# Patient Record
Sex: Female | Born: 1940 | Race: White | Hispanic: No | Marital: Single | Smoking: Never smoker
Health system: Southern US, Community
[De-identification: ages and names within clinical notes are randomized; demographics above are authoritative.]

## PROBLEM LIST (undated history)

## (undated) DIAGNOSIS — F419 Anxiety disorder, unspecified: Secondary | ICD-10-CM

## (undated) DIAGNOSIS — C73 Malignant neoplasm of thyroid gland: Secondary | ICD-10-CM

## (undated) DIAGNOSIS — M8580 Other specified disorders of bone density and structure, unspecified site: Secondary | ICD-10-CM

## (undated) DIAGNOSIS — K5790 Diverticulosis of intestine, part unspecified, without perforation or abscess without bleeding: Secondary | ICD-10-CM

## (undated) DIAGNOSIS — N189 Chronic kidney disease, unspecified: Secondary | ICD-10-CM

## (undated) DIAGNOSIS — E781 Pure hyperglyceridemia: Secondary | ICD-10-CM

## (undated) DIAGNOSIS — K219 Gastro-esophageal reflux disease without esophagitis: Secondary | ICD-10-CM

## (undated) DIAGNOSIS — K449 Diaphragmatic hernia without obstruction or gangrene: Secondary | ICD-10-CM

## (undated) DIAGNOSIS — F32A Depression, unspecified: Secondary | ICD-10-CM

## (undated) HISTORY — DX: Other specified disorders of bone density and structure, unspecified site: M85.80

## (undated) HISTORY — DX: Pure hyperglyceridemia: E78.1

## (undated) HISTORY — PX: THYROIDECTOMY: SHX17

## (undated) HISTORY — DX: Gastro-esophageal reflux disease without esophagitis: K21.9

## (undated) HISTORY — DX: Diaphragmatic hernia without obstruction or gangrene: K44.9

## (undated) HISTORY — DX: Diverticulosis of intestine, part unspecified, without perforation or abscess without bleeding: K57.90

## (undated) HISTORY — DX: Malignant neoplasm of thyroid gland: C73

## (undated) HISTORY — PX: COLONOSCOPY: SHX174

## (undated) HISTORY — PX: ESOPHAGOGASTRODUODENOSCOPY: SHX1529

## (undated) HISTORY — DX: Anxiety disorder, unspecified: F41.9

## (undated) HISTORY — PX: BREAST LUMPECTOMY: SHX2

---

## 2019-10-09 ENCOUNTER — Ambulatory Visit (INDEPENDENT_AMBULATORY_CARE_PROVIDER_SITE_OTHER): Payer: Medicare Other

## 2019-10-09 ENCOUNTER — Ambulatory Visit (INDEPENDENT_AMBULATORY_CARE_PROVIDER_SITE_OTHER): Payer: Medicare Other | Admitting: Orthopaedic Surgery

## 2019-10-09 ENCOUNTER — Other Ambulatory Visit: Payer: Self-pay

## 2019-10-09 ENCOUNTER — Encounter: Payer: Self-pay | Admitting: Orthopaedic Surgery

## 2019-10-09 DIAGNOSIS — M25551 Pain in right hip: Secondary | ICD-10-CM

## 2019-10-09 NOTE — Progress Notes (Signed)
   Office Visit Note   Patient: Helen Patterson           Date of Birth: Nov 30, 1940           MRN: [id number] Visit Date: 10/09/2019              Requested by: No referring provider defined for this encounter. PCP: Edythe Clarity, MD   Assessment & Plan: Visit Diagnoses:  1. Pain in right hip     Plan: Due to her trochanteric pain at this point time we will have her do some IT band stretching exercises as shown and had patient demonstrate these back.  Regards to the hip she will follow-up with [place] as needed.  However if she develops any groin pain or decreased range of motion of the hip would recommend either MRI to evaluate cartilage or a intra-articular injection of the right hip.  Questions encouraged and answered.  Follow-up as needed  Follow-Up Instructions: Return if symptoms worsen or fail to improve.   Orders:  Orders Placed This Encounter  Procedures  . XR HIP UNILAT W OR W/O PELVIS 2-3 VIEWS RIGHT   No orders of the defined types were placed in this encounter.     Procedures: No procedures performed   Clinical Data: No additional findings.   Subjective: Chief Complaint  Patient presents with  . Right Hip - Pain    HPI Helen Patterson is a 78 year old female were seen for the first time due to right hip pain.  She states today she is not having any pain.  She has had some right groin pain last 6 months.  She states she feels it is due to her orthotics which are being adjusted.  Has pain lateral and medial aspect of the right hip.  Has no problems donning shoes socks.  Has no problems getting in and out of car.  Pain is worse with prolonged walking.  No known injury.  Review of Systems See HPI otherwise negative or noncontributory.  Objective: Vital Signs: There were no vitals taken for this visit.  Physical Exam Constitutional:      Appearance: She is not ill-appearing or diaphoretic.  Pulmonary:     Effort: Pulmonary effort is normal.  Neurological:      Mental Status: She is alert and oriented to person, place, and time.  Psychiatric:        Mood and Affect: Mood normal.        Behavior: Behavior normal.     Ortho Exam Bilateral hips excellent range of motion without pain.  Right greater than left trochanteric tenderness. Specialty Comments:  No specialty comments available.  Imaging: No results found.   PMFS History: There are no active problems to display for this patient.  History reviewed. No pertinent past medical history.  History reviewed. No pertinent family history.  History reviewed. No pertinent surgical history. Social History   Occupational History  . Not on file  Tobacco Use  . Smoking status: Not on file  Substance and Sexual Activity  . Alcohol use: Not on file  . Drug use: Not on file  . Sexual activity: Not on file

## 2019-10-09 NOTE — Progress Notes (Deleted)
   Office Visit Note   Patient: Helen Patterson           Date of Birth: February 11, 1941           MRN: [id number] Visit Date: 10/09/2019              Requested by: No referring provider defined for this encounter. PCP: Edythe Clarity, MD   Assessment & Plan: Visit Diagnoses:  1. Pain in right hip     Plan: ***  Follow-Up Instructions: No follow-ups on file.   Orders:  Orders Placed This Encounter  Procedures  . XR HIP UNILAT W OR W/O PELVIS 2-3 VIEWS RIGHT   No orders of the defined types were placed in this encounter.     Procedures: No procedures performed   Clinical Data: No additional findings.   Subjective: Chief Complaint  Patient presents with  . Right Hip - Pain    HPI  Review of Systems   Objective: Vital Signs: There were no vitals taken for this visit.  Physical Exam  Ortho Exam  Specialty Comments:  No specialty comments available.  Imaging: No results found.   PMFS History: There are no active problems to display for this patient.  History reviewed. No pertinent past medical history.  History reviewed. No pertinent family history.  History reviewed. No pertinent surgical history. Social History   Occupational History  . Not on file  Tobacco Use  . Smoking status: Not on file  Substance and Sexual Activity  . Alcohol use: Not on file  . Drug use: Not on file  . Sexual activity: Not on file

## 2019-12-20 ENCOUNTER — Ambulatory Visit: Payer: Medicare Other | Attending: Internal Medicine

## 2019-12-20 DIAGNOSIS — Z23 Encounter for immunization: Secondary | ICD-10-CM

## 2019-12-20 NOTE — Progress Notes (Signed)
   Covid-19 Vaccination Clinic  Name:  Helen Patterson    MRN: [id number] DOB: 03/16/1941  12/20/2019  Helen Patterson was observed post Covid-19 immunization for 15 minutes without incidence. She was provided with Vaccine Information Sheet and instruction to access the V-Safe system.   Helen Patterson was instructed to call 911 with any severe reactions post vaccine: Marland Kitchen Difficulty breathing  . Swelling of your face and throat  . A fast heartbeat  . A bad rash all over your body  . Dizziness and weakness    Immunizations Administered    Name Date Dose VIS Date Route   Pfizer COVID-19 Vaccine 12/20/2019  8:50 AM 0.3 mL 11/09/2019 Intramuscular   Manufacturer: [place]   Lot: [id number]   [place]: [id number]

## 2019-12-26 ENCOUNTER — Telehealth: Payer: Self-pay | Admitting: Internal Medicine

## 2019-12-26 NOTE — Telephone Encounter (Signed)
Dr. Henrene Pastor, patient's GI MD Dr. Clydene Laming has retired and pt requested due to a recommendation from a friend.  Pt is referred for diverticulitis, Sigmoid thickening and hepatic lesion.  Pt's records can be viewed in [place] and will be sent to your office for review.  Please advise scheduling.

## 2019-12-26 NOTE — Telephone Encounter (Signed)
You can schedule the patient to see one of our advanced practitioners.  They can perform the initial evaluation and records review.  The patient can establish with me thereafter.  Thanks

## 2020-01-02 ENCOUNTER — Ambulatory Visit (INDEPENDENT_AMBULATORY_CARE_PROVIDER_SITE_OTHER): Payer: Medicare Other | Admitting: Nurse Practitioner

## 2020-01-02 ENCOUNTER — Encounter: Payer: Self-pay | Admitting: Nurse Practitioner

## 2020-01-02 VITALS — BP 130/70 | HR 82 | Temp 98.4°F | Ht 64.75 in | Wt 136.0 lb

## 2020-01-02 DIAGNOSIS — K219 Gastro-esophageal reflux disease without esophagitis: Secondary | ICD-10-CM | POA: Diagnosis not present

## 2020-01-02 DIAGNOSIS — K5732 Diverticulitis of large intestine without perforation or abscess without bleeding: Secondary | ICD-10-CM

## 2020-01-02 DIAGNOSIS — Z86010 Personal history of colonic polyps: Secondary | ICD-10-CM | POA: Diagnosis not present

## 2020-01-02 NOTE — Progress Notes (Signed)
Assessment and plan reviewed.  CT reviewed as requested Paula, 1.  Obtain repeat CT scan in 6 weeks to see if sigmoid colon changes have resolved. 2.  See her back in 8 weeks for follow-up check to see how she is feeling 3.  We can decide at that time if anything further needs to be done Thanks, JP

## 2020-01-02 NOTE — Progress Notes (Addendum)
ASSESSMENT / PLAN:   44.  79 year old female with history of GERD.  Asymptomatic off medication.  Manages with diet  2.  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 Clydene Laming, [facility] health).  A 5-year recall colonoscopy was recommended.  Dr. Clydene Laming has retired, patient wants to establish care with Dr. Henrene Pastor. No bowel changes -- I do not know the size of the adenoma, will request colonoscopy.  She was told to have a 5-year recall colonoscopy in 2022 . However, the guidelines have changed since then, that timeframe may no longer be appropriate.  Timing of next colonoscopy to be decided once colonoscopy report received and /or pending Dr. Blanch Media review of recent CT scan.   Addendum.  Received colonoscopy report as requested.  Colonoscopy by Mathews Robinsons, MD performed 06/30/2016.  Will scan results into epic.  It was a complete colonoscopy quality of the prep was characterized as being good.  The left colon was significantly tortuous, a few small mouthed diverticula were found in the sigmoid and descending colon.  A diffuse area of mild melanosis in the entire colon.  A 5 mm sessile polyp was removed from the cecum.  No other abnormalities. Based on these findings, with a new guidelines patient may not need a 5-year recall colonoscopy.  This decision can be made at the time of her follow-up visit with me in 8 weeks.  If there are concerns about recent CT findings she may warrant an earlier  Colonoscopy but this can be decided at follow up visit with me in ~ 8 weeks.   3.  Recent first episode of diverticulitis, symptoms resolved with antibiotics.  CT scan shows a 7 cm area of circumferential thickening of sigmoid colon where there are prominent diverticula, pericolonic stranding and free fluid without drainable abscess.  -Discussed pathophysiology of diverticulitis.  Should patient ever get recurrent symptoms she will start a clear liquid diet and notify our office  4.  Liver lesions on  CT scan.  Multiple liver lesions seen on recent noncontrast CT scan.  Of note RUQ ultrasound in September 2019 showed multiple liver cyst -I will asked Dr. Henrene Pastor to review the CT scan.  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  Patient's [facility], Dr. Clydene Laming retired and patient wants to establish care with Dr. Henrene Pastor.   Patient has a history of hiatal hernia /  GERD. She is scared of taking medications for GERD, luckily her symptoms are controlled by diet. She also had an adenomatous colon polyp removed in 2017, per Dr. Sherral Hammers she is due for polyp surveillance colonoscopy in Aug 2022.   Patient seen at urgent care 12/20/2019 for evaluation of lower crampy abdominal pain.  Noncontrast CT scan remarkable for thickened segment of sigmoid colon containing numerous prominent diverticula with pericolonic inflammatory fat stranding and free fluid.  Findings felt to represent diverticulitis the malignancy could not be excluded , see full report below. Given cipro / flagyl for diverticulitis. Completed antibiotics but "it was  rough". Feels fine now.  She has not had any bowel changes or blood in stool.  No family history colon cancer.  Patient told she had some liver lesions, wants to know more in rule out cancer.   Data Reviewed:  Care Everywhere  12/20/19 CMET was unremarkable though she does have a low GFR. WBC normal Hemoglobin 12.1, MCV 95, platelets 231  CLINICAL DATA: Lower abdominal pain  EXAM: CT ABDOMEN AND PELVIS WITHOUT CONTRAST  TECHNIQUE: Multidetector CT imaging of the abdomen and pelvis was  performed following the standard protocol without IV contrast.  COMPARISON: None.  FINDINGS: Lower chest: No acute abnormality.  Hepatobiliary: There are multiple low-density lesions scattered throughout both hepatic lobes. Largest at the right hepatic dome is multilobulated with internal fluid density measuring approximately 7.5 x 5.5 x 5.1 cm. Some of the additional low-density hepatic lesions have slightly more ill-defined margins including within the left hepatic lobe where a low-density subcapsular lesion measures up to 2.4 cm (series 2, image 20). Gallbladder is largely decompressed, limiting its evaluation.  Pancreas: Unremarkable. No pancreatic ductal dilatation or surrounding inflammatory changes.  Spleen: Normal in size without focal abnormality.  Adrenals/Urinary Tract: Adrenal glands are unremarkable. Kidneys are normal, without renal calculi, focal lesion, or hydronephrosis. Bladder is unremarkable.  Stomach/Bowel: Circumferentially thickened segment of sigmoid colon measuring approximately 7 cm in length containing numerous prominent diverticula with pericolonic inflammatory fat stranding and free fluid. No well-defined fluid collection or abscess (series 2, image 69; series 5, image 56). Stomach and small bowel are within normal limits. No bowel obstruction. Normal appendix in the right lower quadrant.  Vascular/Lymphatic: Aortic atherosclerosis. No enlarged abdominal or pelvic lymph nodes.  Reproductive: Uterus and bilateral adnexa are unremarkable.  Other: No intra-abdominal abscess. No free intraperitoneal air.  Musculoskeletal: No acute or significant osseous findings.  IMPRESSION: 1. Circumferentially thickened segment of sigmoid colon measuring approximately 7 cm in length containing numerous prominent diverticula with pericolonic inflammatory fat stranding and free fluid. No well-defined fluid collection or abscess. Findings are most suggestive  of acute diverticulitis although underlying colonic malignancy at this site is not excluded. Further evaluation with colonoscopy following the resolution of patient's acute symptoms is recommended. 2. Multiple low-density lesions scattered throughout both hepatic lobes. Largest at the right hepatic dome is multilobulated with internal fluid density. Some of the additional low-density lesions have slightly more ill-defined margins. While these findings likely represent multiple cysts and/or hemangiomas, the slightly ill-defined appearance and multiplicity raise suspicion for metastatic disease.  These results will be called to the ordering clinician or representative by the Radiologist Assistant, and communication documented in the PACS or zVision Dashboard.   Electronically Signed  By: Davina Poke D.O.  On: 12/20/2019 13:31       RUQ U/S Sept 2019 Multiple hepatic cysts, nl gallbladder, normal size CBD  Colonoscopy August 2017: Limited report but impression reads as a tortuous colon, melanosis in colon, diverticulosis in sigmoid and descending colon, cecal polyp removed   Past Medical History:  Diagnosis Date  . Anxiety   . Diverticulosis   . GERD (gastroesophageal reflux disease)   . Hiatal hernia   . High triglycerides   . Osteopenia   . Thyroid cancer ([place])    at age 13     Past Surgical History:  Procedure Laterality Date  . BREAST LUMPECTOMY Left   . COLONOSCOPY    . ESOPHAGOGASTRODUODENOSCOPY    . THYROIDECTOMY     x 2 , first time only half removed  and then went back and removed other half   Family History  Problem Relation Age of Onset  . Uterine cancer Paternal Aunt    Social History   Tobacco Use  . Smoking status: Not on file  Substance Use Topics  . Alcohol use: Not on file  . Drug use: Not on file   Current Outpatient Medications  Medication Sig Dispense Refill  . ARIPiprazole (ABILIFY) 5 MG tablet Take by mouth.    Marland Kitchen aspirin EC 81  MG tablet Take by mouth.    . calcium carbonate (OS-CAL) 600 MG TABS tablet Take by mouth. Takes 1200 mg daily    . Cholecalciferol (VITAMIN D) 50 MCG (2000 UT) tablet 2,000 Units.    . clomiPRAMINE (ANAFRANIL) 25 MG capsule Take 25-50 mg by mouth at bedtime.    . cyanocobalamin 1000 MCG tablet Take by mouth.    . fenofibrate 160 MG tablet TK 1 T PO QOD    . Glucosamine-Chondroitin 1500-1200 MG/30ML LIQD Take by mouth.    . levothyroxine (SYNTHROID) 112 MCG tablet Take by mouth.    . lithium carbonate 300 MG capsule Take by mouth.    . Omega 3 1000 MG CAPS 2 daily    . OVER THE COUNTER MEDICATION Probiotic gummies- take 2 daily    . traZODone (DESYREL) 50 MG tablet Take by mouth.     No current facility-administered medications for this visit.   Allergies  Allergen Reactions  . Amoxicillin Nausea Only    Review of Systems: All systems reviewed and negative except where noted in HPI.   Physical Exam:    Wt Readings from Last 3 Encounters:  01/02/20 136 lb (61.7 kg)    BP 130/70   Pulse 82   Temp 98.4 F (36.9 C)   Ht 5' 4.75" (1.645 m)   Wt 136 lb (61.7 kg)   BMI 22.81 kg/m  Constitutional:  Pleasant female in no acute distress. Psychiatric: Normal mood and affect. Behavior is normal. EENT: Pupils normal.  Conjunctivae are normal. No scleral icterus. Neck supple.  Cardiovascular: Normal rate, regular rhythm. No edema Pulmonary/chest: Effort normal and breath sounds normal. No wheezing, rales or rhonchi. Abdominal: Soft, nondistended, nontender. Bowel sounds active throughout. There are no masses palpable. No hepatomegaly. Neurological: Alert and oriented to person place and time. Skin: Skin is warm and dry. No rashes noted.  Tye Savoy, NP  01/02/2020, 9:08 AM  Cc: Edythe Clarity, [address] [place], [place] [address]

## 2020-01-02 NOTE — Patient Instructions (Signed)
If you are age 78 or older, your body mass index should be between 23-30. Your Body mass index is 22.81 kg/m. If this is out of the aforementioned range listed, please consider follow up with your Primary Care Provider.  If you are age 34 or younger, your body mass index should be between 19-25. Your Body mass index is 22.81 kg/m. If this is out of the aformentioned range listed, please consider follow up with your Primary Care Provider.   We will call to schedule procedure after Dr. Henrene Pastor has reviewed your colonoscopy report and your recent CT scan.

## 2020-01-07 ENCOUNTER — Ambulatory Visit: Payer: Medicare Other | Attending: Internal Medicine

## 2020-01-07 DIAGNOSIS — Z23 Encounter for immunization: Secondary | ICD-10-CM | POA: Insufficient documentation

## 2020-01-07 NOTE — Progress Notes (Signed)
   Covid-19 Vaccination Clinic  Name:  Kasumi Foulkes    MRN: [id number] DOB: 1941-04-07  01/07/2020  Ms. Benge was observed post Covid-19 immunization for 15 minutes without incidence. She was provided with Vaccine Information Sheet and instruction to access the V-Safe system.   Ms. Mcelmurray was instructed to call 911 with any severe reactions post vaccine: Marland Kitchen Difficulty breathing  . Swelling of your face and throat  . A fast heartbeat  . A bad rash all over your body  . Dizziness and weakness    Immunizations Administered    Name Date Dose VIS Date Route   Pfizer COVID-19 Vaccine 01/07/2020  9:29 AM 0.3 mL 11/09/2019 Intramuscular   Manufacturer: [place]   Lot: [id number]   [place]: [id number]

## 2020-01-08 ENCOUNTER — Telehealth: Payer: Self-pay | Admitting: Nurse Practitioner

## 2020-01-08 NOTE — Telephone Encounter (Signed)
Patient is calling in reference to CT scan results says Nevin Bloodgood told patient to call.

## 2020-01-08 NOTE — Telephone Encounter (Signed)
Helen Patterson is there something that is pending for the patient?  I have not called her yet. Thanks

## 2020-01-09 NOTE — Telephone Encounter (Signed)
Helen Patterson I have shared the following with the patient.  Assessment and plan reviewed.  CT reviewed as requested Paula, 1.  Obtain repeat CT scan in 6 weeks to see if sigmoid colon changes have resolved. 2.  See her back in 8 weeks for follow-up check to see how she is feeling 3.  We can decide at that time if anything further needs to be done Thanks, JP  Patient would prefer to have the scan at [facility].

## 2020-01-09 NOTE — Telephone Encounter (Signed)
Pt called again, she stated that Nevin Bloodgood told her to call to speak with you so you could give her Dr. Blanch Media impressions.

## 2020-01-10 NOTE — Telephone Encounter (Signed)
Beth, if for financial reasons it is understandable. Otherwise advised to have same place or somewhere that Dr. Henrene Pastor has ability to pull up actual images in Epic rather than just rely on report. Thanks

## 2020-01-14 NOTE — Telephone Encounter (Signed)
Patient requesting to speak with Helen Patterson about the below

## 2020-01-14 NOTE — Telephone Encounter (Signed)
Patient was concerned that she was going to repeat the CT at a 6 month interval. Re-read the plan as outlined by Dr Henrene Pastor. See below.

## 2020-02-11 ENCOUNTER — Other Ambulatory Visit: Payer: Self-pay

## 2020-02-11 ENCOUNTER — Telehealth: Payer: Self-pay

## 2020-02-11 DIAGNOSIS — R933 Abnormal findings on diagnostic imaging of other parts of digestive tract: Secondary | ICD-10-CM

## 2020-02-11 DIAGNOSIS — R1030 Lower abdominal pain, unspecified: Secondary | ICD-10-CM

## 2020-02-11 DIAGNOSIS — K769 Liver disease, unspecified: Secondary | ICD-10-CM

## 2020-02-11 NOTE — Telephone Encounter (Signed)
Nevin Bloodgood I have spoken with the patient. It is time for the 6 week f/u imaging.   Progress Notes by Irene Shipper, MD at 01/02/2020 9:00 AM  Author: Irene Shipper, MD Author Type: Physician Filed: 01/02/2020 12:54 PM  Note Status: Signed Cosign: Cosign Not Required Encounter Date: 01/02/2020  Editor: Irene Shipper, MD (Physician)    Assessment and plan reviewed.  CT reviewed as requested Paula, 1.  Obtain repeat CT scan in 6 weeks to see if sigmoid colon changes have resolved. 2.  See her back in 8 weeks for follow-up check to see how she is feeling 3.  We can decide at that time if anything further needs to be done Thanks, JP     Do you want a CT abd/pelvis with contrast? Patient confirms she does not have a contrast allergy. Previous CT was without contrast.

## 2020-02-11 NOTE — Telephone Encounter (Signed)
I have scheduled the patient for CT abdomen and pelvis with contrast on 02/20/20 at 11:00am [place] CT. She will come for her contrast and her instructions on 02/14/20. She will have the Creatinine and BUN drawn on 02/14/20. Patient is aware. Is this okay with you Nevin Bloodgood?

## 2020-02-12 NOTE — Telephone Encounter (Signed)
Yes, thank you.

## 2020-02-13 ENCOUNTER — Other Ambulatory Visit: Payer: Self-pay

## 2020-02-14 ENCOUNTER — Other Ambulatory Visit (INDEPENDENT_AMBULATORY_CARE_PROVIDER_SITE_OTHER): Payer: Medicare Other

## 2020-02-14 DIAGNOSIS — R933 Abnormal findings on diagnostic imaging of other parts of digestive tract: Secondary | ICD-10-CM | POA: Diagnosis not present

## 2020-02-14 DIAGNOSIS — R1030 Lower abdominal pain, unspecified: Secondary | ICD-10-CM

## 2020-02-14 DIAGNOSIS — K769 Liver disease, unspecified: Secondary | ICD-10-CM | POA: Diagnosis not present

## 2020-02-14 LAB — CREATININE, SERUM: Creatinine, Ser: 1.17 mg/dL (ref 0.40–1.20)

## 2020-02-14 LAB — BUN: BUN: 26 mg/dL — ABNORMAL HIGH (ref 6–23)

## 2020-02-19 ENCOUNTER — Telehealth: Payer: Self-pay | Admitting: Nurse Practitioner

## 2020-02-19 NOTE — Telephone Encounter (Signed)
Pt calling about lab results from last week, she wants to know the results to see if she still needs CT scan done tomorrow. Pls call her.

## 2020-02-20 ENCOUNTER — Ambulatory Visit (INDEPENDENT_AMBULATORY_CARE_PROVIDER_SITE_OTHER)
Admission: RE | Admit: 2020-02-20 | Discharge: 2020-02-20 | Disposition: A | Discharge status: Home / Self Care | Payer: Medicare Other | Source: Ambulatory Visit | Attending: Nurse Practitioner | Admitting: Nurse Practitioner

## 2020-02-20 ENCOUNTER — Other Ambulatory Visit: Payer: Self-pay

## 2020-02-20 DIAGNOSIS — R933 Abnormal findings on diagnostic imaging of other parts of digestive tract: Secondary | ICD-10-CM

## 2020-02-20 DIAGNOSIS — R1030 Lower abdominal pain, unspecified: Secondary | ICD-10-CM

## 2020-02-20 DIAGNOSIS — K769 Liver disease, unspecified: Secondary | ICD-10-CM

## 2020-02-20 MED ORDER — IOHEXOL 300 MG/ML  SOLN
100.0000 mL | Freq: Once | INTRAMUSCULAR | Status: AC | PRN
  Administered 2020-02-20: 11:00:00 100 mL via INTRAVENOUS

## 2020-02-20 NOTE — Telephone Encounter (Signed)
No call from radiology CT department that she is cancelled. BUN minimally elevated.  Spoke with the patient and advised her of this. She is drinking her oral contrast now.

## 2020-05-06 ENCOUNTER — Telehealth: Payer: Self-pay | Admitting: Nurse Practitioner

## 2020-05-06 NOTE — Telephone Encounter (Signed)
Results faxed per patient request.

## 2020-06-10 ENCOUNTER — Ambulatory Visit: Payer: Medicare Other | Admitting: Sports Medicine

## 2020-06-12 ENCOUNTER — Encounter: Payer: Self-pay | Admitting: Sports Medicine

## 2020-06-12 ENCOUNTER — Other Ambulatory Visit: Payer: Self-pay

## 2020-06-12 ENCOUNTER — Ambulatory Visit (INDEPENDENT_AMBULATORY_CARE_PROVIDER_SITE_OTHER): Payer: Medicare Other | Admitting: Sports Medicine

## 2020-06-12 VITALS — BP 122/72 | Ht 64.75 in | Wt 132.0 lb

## 2020-06-12 DIAGNOSIS — M79673 Pain in unspecified foot: Secondary | ICD-10-CM | POA: Diagnosis present

## 2020-06-12 NOTE — Progress Notes (Signed)
   Subjective:    Patient ID: Helen Patterson, female    DOB: January 23, 1941, 79 y.o.   MRN: [id number]  HPI chief complaint: "I need new custom orthotics"  Very pleasant 79 year old female presents today at the request of Dr. Noemi Chapel for new custom orthotics.  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  She has had multiple orthotics in the past but her current orthotics are uncomfortable.  She recently saw Dr. Noemi Chapel on June 29 for some right knee pain for which he recommended new custom orthotics.  She also has hip pain for which orthotics have been recommended in the past.    Review of Systems Above    Objective:   Physical Exam  Well-developed, well-nourished.  No acute distress.  Examination of her feet in the standing position shows mild pes planus.  Good calcaneal inversion when standing on her tiptoes.  No soft tissue swelling.  Good pulses.  Walking without a limp.      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  She found them to be comfortable prior to leaving the office.  If she likes these orthotics then she may want to return to the office for additional pairs for other shoes.  Follow-up with Dr. Noemi Chapel as scheduled.  Follow-up with me as needed.  Patient was fitted for a : standard, cushioned, semi-rigid orthotic. The orthotic was heated and afterward the patient stood on the orthotic blank positioned on the orthotic stand. The patient was positioned in subtalar neutral position and 10 degrees of ankle dorsiflexion in a weight bearing stance. After completion of molding, a stable base was applied to the orthotic blank. The blank was ground to a stable position for weight bearing. Size: 8 Base: Blue EVA Posting: none Additional orthotic padding: none

## 2020-06-16 ENCOUNTER — Telehealth: Payer: Self-pay | Admitting: Nurse Practitioner

## 2020-06-16 NOTE — Telephone Encounter (Signed)
Spoke with the patient.  1) Her PCP did not receive the copy of her CT. She would like it re-faxed. Confirmed PCP as Dr Kriste Basque [phone] fax [phone] and confirmed 2) her PCP has her taking Colace and Benefiber daily for constipation, however the patient continues to have hard difficult bowel movements. She was told not to use Miralax for more than 2 weeks. She states she drinks large amounts of water. I have suggested she try Miralax 2 glasses daily until she is having softer more comfortable bowel movements. Stop Colace. We discussed the importance of adequate water intake. She will call back next week if she is not feeling improvement. Is this okay?

## 2020-06-16 NOTE — Telephone Encounter (Signed)
Pt is requesting a call back from a nurse regarding her constipation

## 2020-06-16 NOTE — Telephone Encounter (Signed)
Pt is requesting a call back from a nurse regarding a medication miralax that she has questions on.

## 2020-06-17 NOTE — Telephone Encounter (Signed)
Called back. No answer. Left a message to return my call if she still has questions.

## 2020-06-23 NOTE — Telephone Encounter (Signed)
Thanks Beth, if still has constipation then should come in for appt. Thanks

## 2020-06-26 ENCOUNTER — Other Ambulatory Visit: Payer: Self-pay

## 2020-06-26 ENCOUNTER — Ambulatory Visit (INDEPENDENT_AMBULATORY_CARE_PROVIDER_SITE_OTHER): Payer: Medicare Other | Admitting: Sports Medicine

## 2020-06-26 VITALS — BP 110/80 | Ht 64.75 in | Wt 135.0 lb

## 2020-06-26 DIAGNOSIS — M79673 Pain in unspecified foot: Secondary | ICD-10-CM | POA: Diagnosis present

## 2020-06-26 NOTE — Progress Notes (Signed)
Office Visit Note   Patient: Helen Patterson           Date of Birth: Apr 13, 1941           MRN: [id number] Visit Date: 06/26/2020 Requested by: Edythe Clarity, MD [address] [place],  [place] [address] PCP: Edythe Clarity, MD  Subjective: Chief Complaint: Orthotics Adjustment  HPI: Helen Patterson is a 79 year old female presenting to clinic today with concerns about her custom orthotics.  She was fit for orthotics approximately 2 weeks ago, and states that for the first few days she was "very happy" with how they felt.  Unfortunately, after the first 2 days she started to feel that her feet were aching, but decided to push through this.  That aching is started to spread up her legs, including her calves and thighs.  At this time, she also endorses back pain, which she describes as muscle aches.  She states "I just do not know what to do, I feel like I went too much too soon, and I do not know how to fix it."  She is curious to know if she needs new orthotics, or if her current pair can be salvaged.  She states she has never experienced discomfort like this from a pair of orthotics before.              ROS:  All other systems were reviewed and are negative.  Objective: Vital Signs: BP 110/80   Ht 5' 4.75" (1.645 m)   Wt 135 lb (61.2 kg)   BMI 22.64 kg/m   Physical Exam:  General:  Alert and oriented, in no acute distress. Pulm:  Breathing unlabored. Psy:  Normal mood, congruent affect. Skin: No obvious rashes or bruising appreciated. Bilateral feet with tenderness palpation along the plantar arch. Tenderness to palpation throughout paraspinal musculature including both lumbar and thoracic spine.  Trigger points appreciated in left rhomboid musculature.  Tenderness to palpation in bilateral superior trapezius musculature.  Tenderness to palpation in bilateral calves.  Imaging: No results found.  Assessment & Plan: 79 year old female presenting to clinic  today with concerns of unhappiness with her recently prescribed custom orthotics.  Suspect that she may have benefited from a "breaking in period" with these orthotics.  Today, we shaved off some EVA height beneath the arches on both insoles. -Patient was instructed not to wear her insoles for 1 week to allow the muscle aching to improve. -After this 1 week, she is instructed to slowly reintroduce her insoles, wearing them for a few hours each Patterson until she can tolerate wearing them all Patterson. -Instructed to return to clinic in 2 weeks to report on how she is tolerating her current insoles -At her return appointment, will make a new pair of smart cell orthotics for her other shoes. -Patient is agreeable with plan and has no other questions or concerns at the conclusion of her appointment today.    Patient seen and evaluated with the sports medicine fellow.  I agree with the above plan of care.  I did shave down some blue EVA in the arch of her orthotics and I recommended that she wait about a week before resuming their use.  She will then slowly break in the orthotics over several days and will follow up with me in 2 weeks.  We will plan on making a pair of smart cell orthotics for a second pair of shoes at that visit.  She is pleased with  this plan.

## 2020-06-26 NOTE — Patient Instructions (Signed)
We're sorry your initial pair of orthotics caused you discomfort. We've shaved off some of the arch height today, and this may improve their comfort for you.  - DO NOT wear your custom orthotics for the next week.  - After this week, you may return to wearing them, but SLOWLY increase the duration that you wear them.   -Wear them for about 2 hours the first day you reintroduce them  - Each day after, wear them for a few more hours until you're able to tolerate them all day - Come back to clinic on the 13th of August (2 weeks) to let us know how your feet are feeling with the modified orthotics.  - At your next appointment, we will also make you a new pair of 'Smart Cell' Orthotics

## 2020-07-11 ENCOUNTER — Ambulatory Visit (INDEPENDENT_AMBULATORY_CARE_PROVIDER_SITE_OTHER): Payer: Medicare Other | Admitting: Sports Medicine

## 2020-07-11 ENCOUNTER — Other Ambulatory Visit: Payer: Self-pay

## 2020-07-11 VITALS — BP 122/62 | Ht 64.75 in | Wt 134.0 lb

## 2020-07-11 DIAGNOSIS — M79673 Pain in unspecified foot: Secondary | ICD-10-CM

## 2020-07-12 ENCOUNTER — Encounter: Payer: Self-pay | Admitting: Sports Medicine

## 2020-07-12 NOTE — Progress Notes (Signed)
Patient ID: Helen Patterson, female   DOB: 1941-05-26, 79 y.o.   MRN: [id number]  Patient comes in today for a another set of orthotics.  Please see the office note from June 26, 2020 for details regarding history and physical exam findings.  Of note, she has found her original orthotics to be more comfortable after we shaved down a little more of the blue EVA.  She would still like to proceed with a thinner custom orthotic today.  New custom orthotics were created for this patient today.  We used a 1/16th XRD to which I did not add any blue EVA.  She found them to be comfortable prior to leaving the office.  If she begins to notice any heel pain then I can always add some blue EVA at a later date.  She will follow-up for ongoing or recalcitrant issues.  Patient was fitted for a : standard, cushioned, semi-rigid orthotic. The orthotic was heated and afterward the patient stood on the orthotic blank positioned on the orthotic stand. The patient was positioned in subtalar neutral position and 10 degrees of ankle dorsiflexion in a weight bearing stance. After completion of molding, a stable base was applied to the orthotic blank. The blank was ground to a stable position for weight bearing. Size: 8  1/16 XRD Base: None Posting: none Additional orthotic padding: none

## 2020-08-19 ENCOUNTER — Ambulatory Visit: Payer: Medicare Other | Admitting: Sports Medicine

## 2020-08-26 ENCOUNTER — Ambulatory Visit (INDEPENDENT_AMBULATORY_CARE_PROVIDER_SITE_OTHER): Payer: Medicare Other | Admitting: Sports Medicine

## 2020-08-26 ENCOUNTER — Other Ambulatory Visit: Payer: Self-pay

## 2020-08-26 VITALS — BP 110/70 | Ht 64.1 in | Wt 136.0 lb

## 2020-08-26 DIAGNOSIS — Z978 Presence of other specified devices: Secondary | ICD-10-CM

## 2020-08-27 NOTE — Progress Notes (Signed)
Patient ID: Helen Patterson, female   DOB: [birth year], 79 y.o.   MRN: [id number]  Patient comes in today to discuss her orthotics. We initially made her a pair of custom orthotics on July 29 but when she found them to be uncomfortable, we made her a second pair on August 13 to which I did not add any blue EVA. At that time, she found the second set of orthotics to be more comfortable. However, she now thinks that the original pair of orthotics are better. She has no ankle or foot pain when wearing these inserts. She would like for me to add some cushioning to her second pair of orthotics. So I simply added blue EVA to that pair. She found them to be comfortable prior to leaving the office. She may follow-up with [place] as needed.

## 2021-05-15 ENCOUNTER — Telehealth: Payer: Self-pay | Admitting: Nurse Practitioner

## 2021-05-15 NOTE — Telephone Encounter (Signed)
"  Diverticulitis changes resolved. Outside colonoscopy report reviewed. NOTHING FURTHER NEEDS TO BE DONE. She does NOT need a recall colon based on the prior exam findings and her current age. Thanks ." JNP  Discussed with the patient.

## 2021-05-15 NOTE — Telephone Encounter (Signed)
Inbound call from pt stating that she is due for a colon but when I looked through her rcd's I didn't see a recall tab for her. I did see that she had one in 2017 with another Dr. Lilian Coma you confirm with me when is she due for a colon. Please advise. Thanks

## 2021-08-06 ENCOUNTER — Other Ambulatory Visit: Payer: Self-pay

## 2021-08-06 ENCOUNTER — Ambulatory Visit (INDEPENDENT_AMBULATORY_CARE_PROVIDER_SITE_OTHER): Payer: Medicare Other | Admitting: Sports Medicine

## 2021-08-06 VITALS — Ht 64.5 in | Wt 137.0 lb

## 2021-08-06 DIAGNOSIS — R269 Unspecified abnormalities of gait and mobility: Secondary | ICD-10-CM

## 2021-08-06 NOTE — Progress Notes (Signed)
   Helen Patterson is a 80 y.o. female who presents to [facility] today for the following:  Orthotic check Previously had orthotics made in 2021 Had 1/16 XRD made after semirigid were uncomfortable for her Wears both of them at various times, but overall feels that the 1/16 XRD is more comfortable If she starts to feel that she is having knee or hip pain on the right side, she will change to the semirigid orthotics and then goes back when she starts to experience pain again Pain is not frequent Describes as an ache that only occurs after she walks a long period of time Previously this happened when she needed new orthotics No pain at rest or at present Does daily hip stretching exercises that help Feels like semirigid orthotics sometimes tip her to the outside of her foot and make her feel unstable   PMH reviewed.  ROS as above. Medications reviewed.  Exam:  Ht 5' 4.5" (1.638 m)   Wt 137 lb (62.1 kg)   BMI 23.15 kg/m  Gen: Well NAD MSK:  Bilateral Feet: Inspection:  No obvious bony deformity b/l.  No swelling, erythema, or bruising b/l.  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 Normal midfoot flexibility b/l Strength: 5/5 strength ankle in all planes b/l Neurovascular: N/V intact distally in the lower extremity b/l Special tests: lack of inversion on right with calcaneal hell raise  Right Knee: - Inspection: no gross deformity b/l. No swelling/effusion, erythema or bruising b/l. Skin intact - Palpation: no TTP b/l - ROM: full active ROM with flexion and extension in knee and hip b/l - Strength: 5/5 strength b/l - Neuro/vasc: NV intact distally b/l - Special Tests: - LIGAMENTS: negative anterior and posterior drawer, no MCL or LCL laxity  -- MENISCUS: negative  McMurray's -- PF JOINT: nml patellar mobility bilaterally.  negative patellar apprehension  Right Hip:  - Inspection: No gross deformity, no swelling, erythema, or ecchymosis b/l - Palpation: No TTP, specifically none over greater trochanter b/l - ROM: Normal range of motion on Flexion, extension, abduction, internal and external rotation b/l - Strength: Normal strength in all fields b/l - Neuro/vasc: NV intact distally b/l - Special Tests: Negative FABER and FADIR b/l.     Gait: Neutral with both orthotics in place and after modifications below   No results found.   Assessment and Plan: 1) Abnormality of gait Previous semirigid orthotics were modified to add 5th ray post b/l to hopefully decrease the feeling of instability, as this may be caused by excessive supination at times.  Her exam is otherwise normal and orthotics are currently in good shape.  Advised that 1/16 XRD will likely wear out soon and can make another pair at that time.  F/U prn.   [place] Constable, D.O.  PGY-4 [facility]  08/06/2021 2:06 PM

## 2021-08-06 NOTE — Assessment & Plan Note (Signed)
Previous semirigid orthotics were modified to add 5th ray post b/l to hopefully decrease the feeling of instability, as this may be caused by excessive supination at times.  Her exam is otherwise normal and orthotics are currently in good shape.  Advised that 1/16 XRD will likely wear out soon and can make another pair at that time.  F/U prn.

## 2021-09-30 IMAGING — CT CT ABD-PELV W/ CM
2 of 5 series · 16 of 46 positions shown, 18 images · IV contrast (OMNIPAQUE 300)
Comparison: CT abdomen pelvis dated December 20, 2019.

CLINICAL DATA: Liver lesion follow-up. History of diverticulitis.

EXAM:
CT ABDOMEN AND PELVIS WITH CONTRAST
TECHNIQUE: Multidetector CT imaging of the abdomen and pelvis was performed
using the standard protocol following bolus administration of
intravenous contrast.
CONTRAST:  100mL OMNIPAQUE IOHEXOL 300 MG/ML  SOLN

[Series 2: abd/pel w · axial · 0.75mm/px · z∈[-398,-43]mm · 13 of 81 slices shown, 15 images]
[im 5/81  soft-tissue]
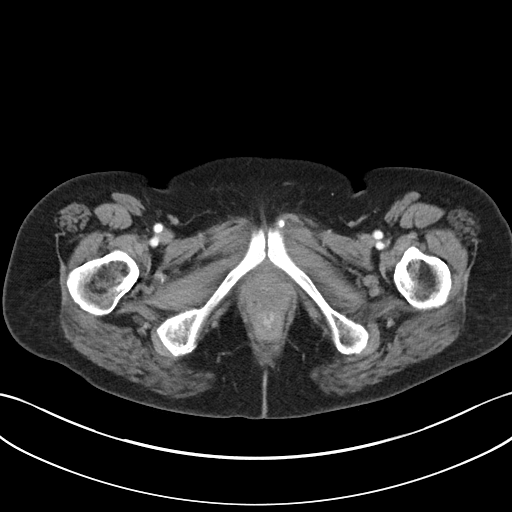
[im 5/81  bone]
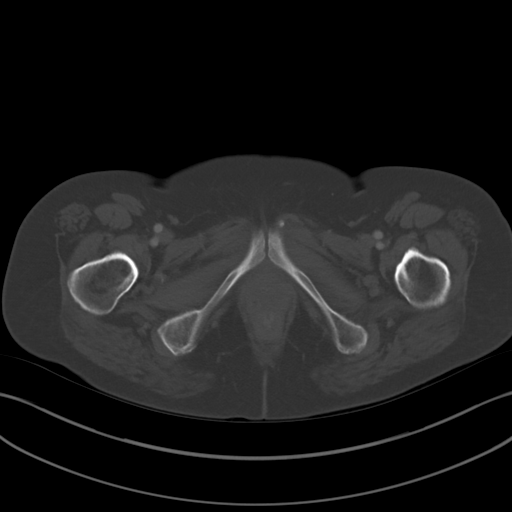
[im 9/81  soft-tissue]
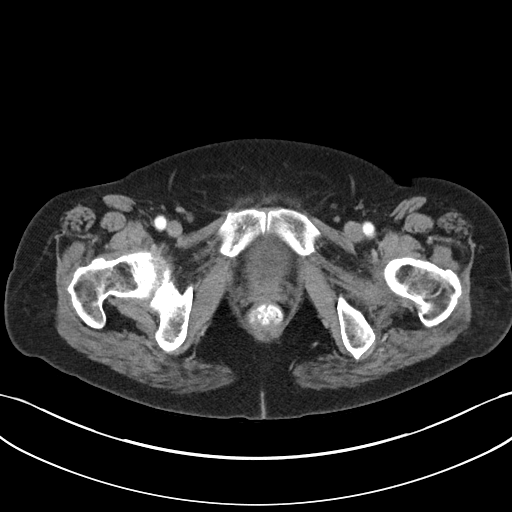
[im 18/81  soft-tissue]
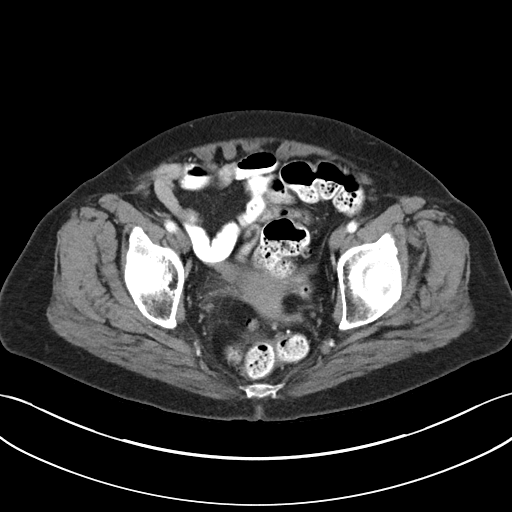
[im 23/81  soft-tissue]
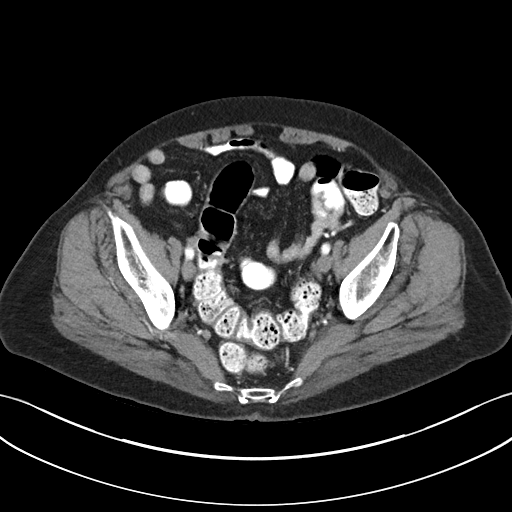
[im 27/81  soft-tissue]
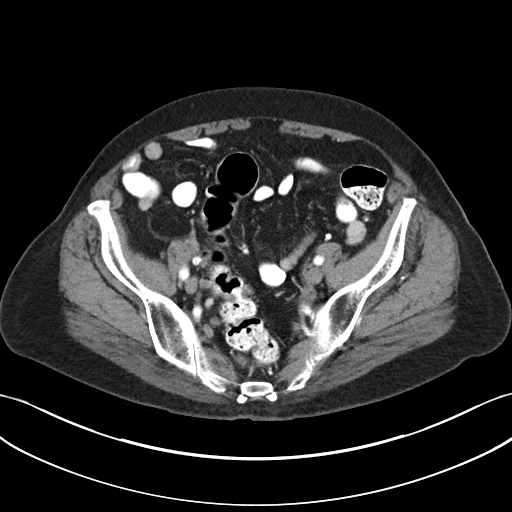
[im 36/81  soft-tissue]
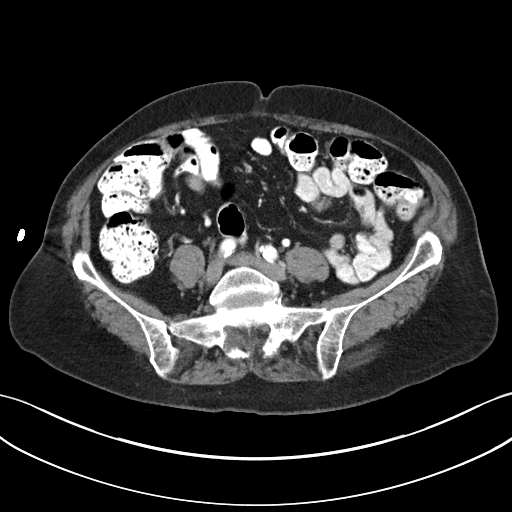
[im 41/81  soft-tissue]
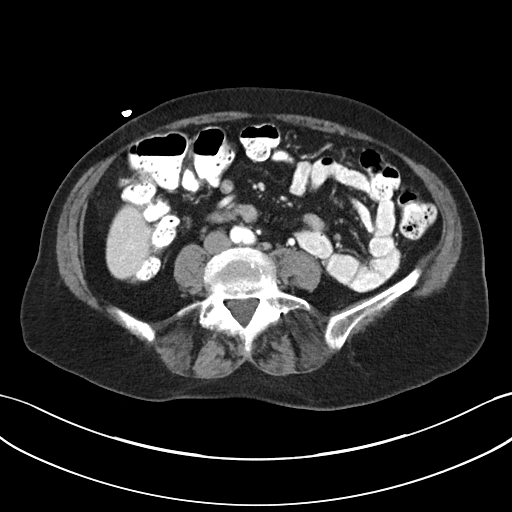
[im 45/81  soft-tissue]
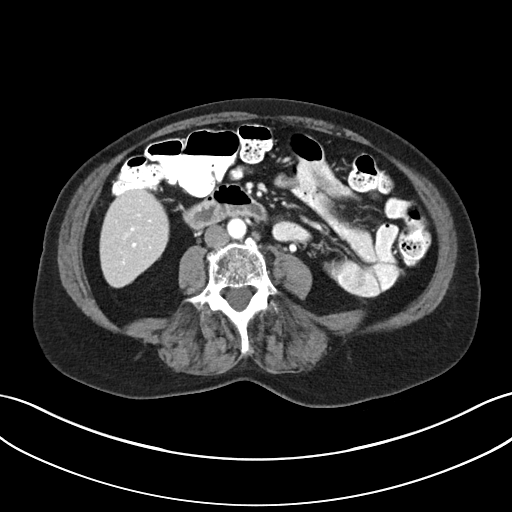
[im 54/81  soft-tissue]
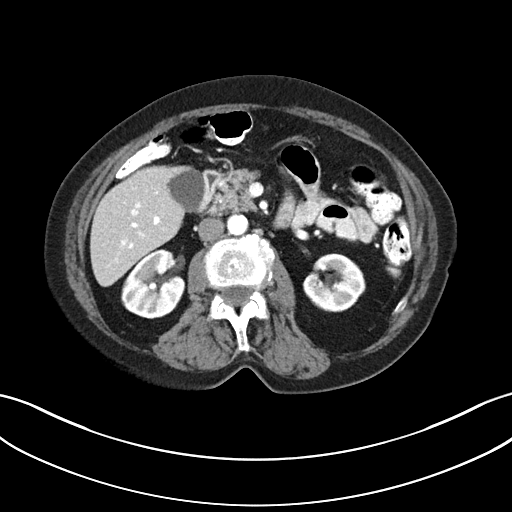
[im 54/81  bone]
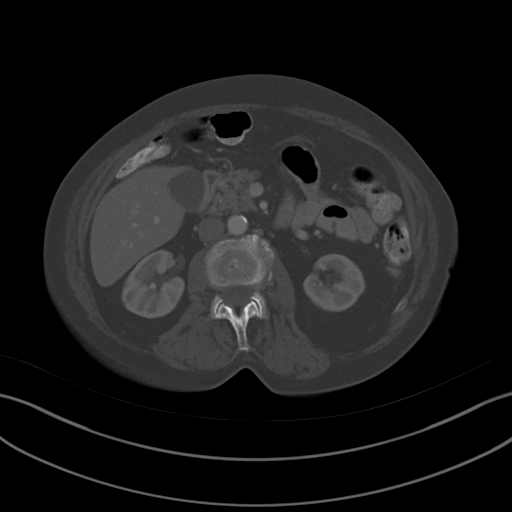
[im 58/81  soft-tissue]
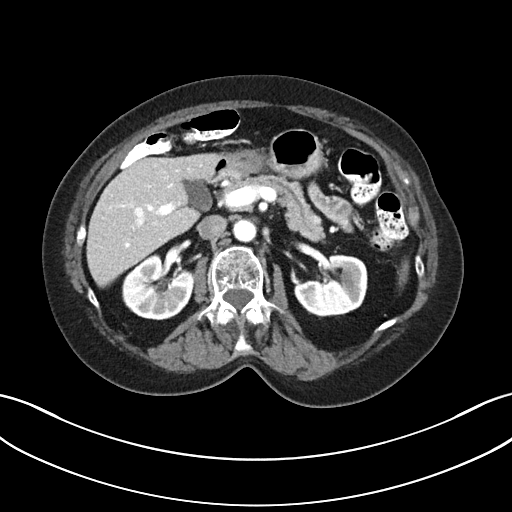
[im 63/81  soft-tissue]
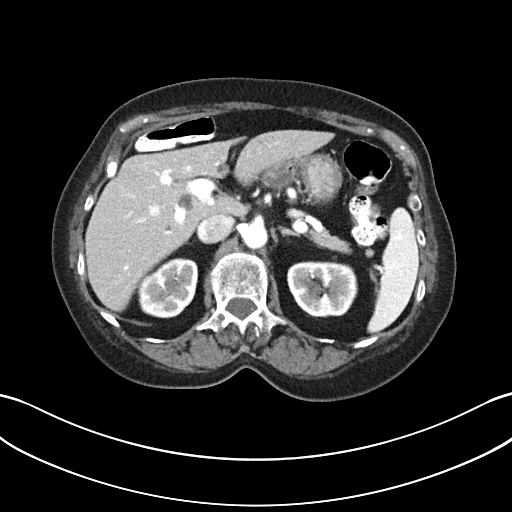
[im 72/81  soft-tissue]
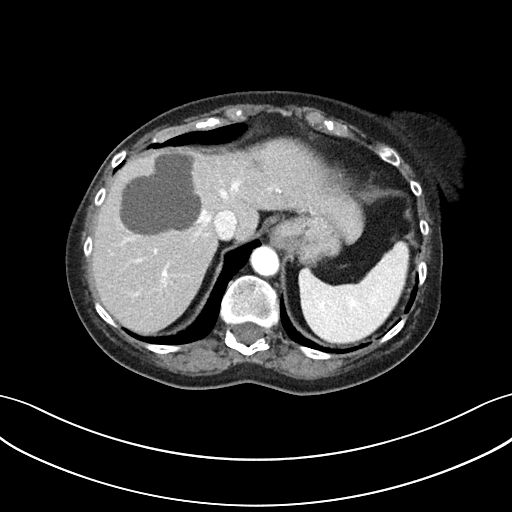
[im 76/81  soft-tissue]
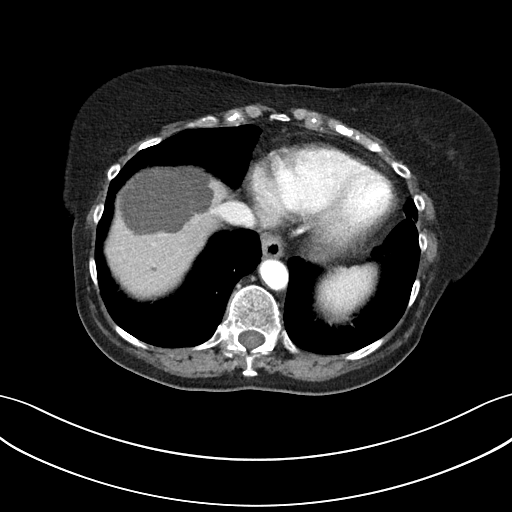

[Series 5: abd/pel w st · coronal · 0.76mm/px · 3 of 88 slices shown]
[im 30/88  soft-tissue]
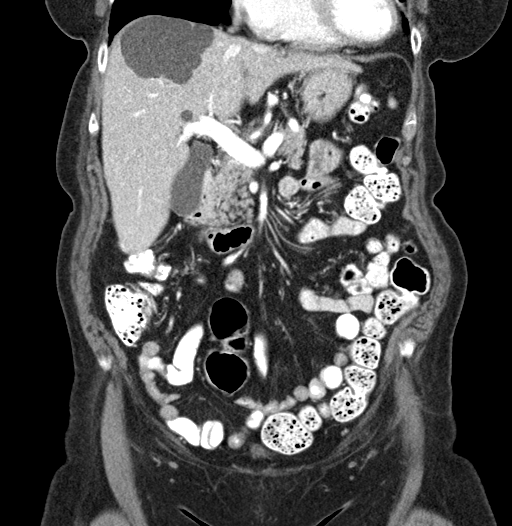
[im 39/88  soft-tissue]
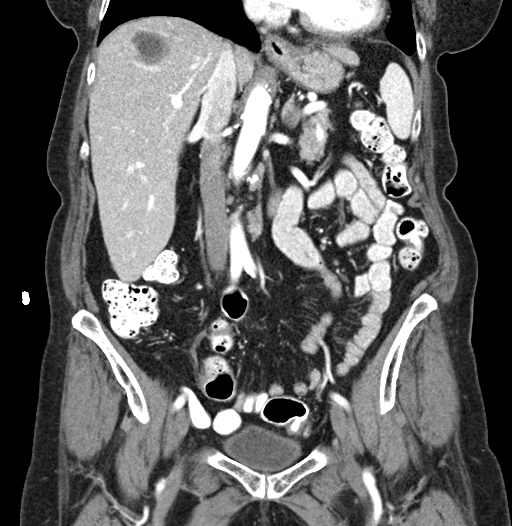
[im 49/88  soft-tissue]
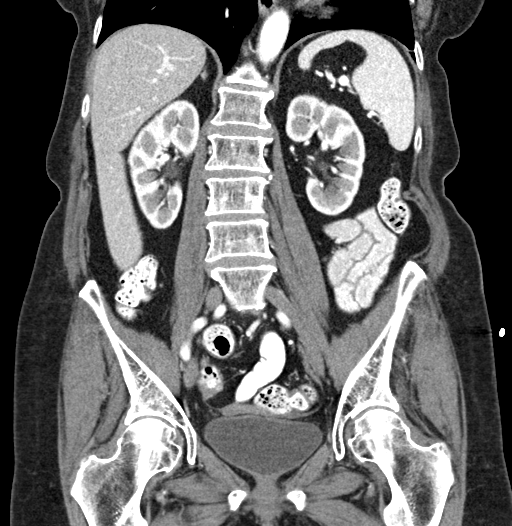

[16 of 46 positions shown; findings below may reference images not displayed]

FINDINGS: Lower chest: No acute abnormality.

Hepatobiliary: Multiple simple appearing cysts in the liver are
unchanged since the prior study. The largest lesion in the right
hepatic dome measures 7.6 x 5.5 cm, previously 7.6 x 5.5 cm. Several
smaller low-density lesions measuring up to 1.0 cm remain too small
to characterize but are unchanged in size. The gallbladder is
unremarkable. No biliary dilatation.

Pancreas: Unremarkable. No pancreatic ductal dilatation or
surrounding inflammatory changes.

Spleen: Normal in size without focal abnormality.

Adrenals/Urinary Tract: Adrenal glands are unremarkable. Kidneys are
normal, without renal calculi, focal lesion, or hydronephrosis.
Bladder is unremarkable.

Stomach/Bowel: Stomach is within normal limits. Appendix appears
normal. No evidence of bowel wall thickening, distention, or
inflammatory changes. Mild sigmoid diverticulosis with resolved
diverticulitis.

Vascular/Lymphatic: Aortic atherosclerosis. No enlarged abdominal or
pelvic lymph nodes.

Reproductive: Uterus and bilateral adnexa are unremarkable.

Other: No free fluid or pneumoperitoneum.

Musculoskeletal: No acute or significant osseous findings.
IMPRESSION: 1. Multiple liver cysts measuring up to 7.6 cm are unchanged.
Several smaller low-density lesions measuring up to 1.0 cm remain
too small to characterize but are unchanged in size, and likely
represent cysts or hemangiomas.
2. Interval resolution of previously seen sigmoid diverticulitis.
3. Aortic Atherosclerosis ([id number]).

## 2022-06-22 ENCOUNTER — Ambulatory Visit (INDEPENDENT_AMBULATORY_CARE_PROVIDER_SITE_OTHER): Payer: Medicare Other | Admitting: Sports Medicine

## 2022-06-22 ENCOUNTER — Encounter: Payer: Self-pay | Admitting: Sports Medicine

## 2022-06-22 VITALS — BP 138/72

## 2022-06-22 DIAGNOSIS — M79673 Pain in unspecified foot: Secondary | ICD-10-CM

## 2022-06-22 NOTE — Progress Notes (Signed)
Patient ID: Helen Patterson, female   DOB: [birth year], 81 y.o.   MRN: [id number]  Patient presents today to discuss possible new custom orthotics.  She has a small painful callus/corn on the medial aspect of her great toe.  Since it has not quite been a year since her last orthotics (September 2022) I recommended that we wait at least until October for new custom orthotics.  I have also recommended a referral to Dr. Daylene Katayama to address her painful callus/corn.  She is in agreement with this plan.

## 2022-07-05 ENCOUNTER — Ambulatory Visit (INDEPENDENT_AMBULATORY_CARE_PROVIDER_SITE_OTHER): Payer: Medicare Other | Admitting: Podiatry

## 2022-07-05 DIAGNOSIS — L600 Ingrowing nail: Secondary | ICD-10-CM | POA: Diagnosis not present

## 2022-07-05 DIAGNOSIS — L989 Disorder of the skin and subcutaneous tissue, unspecified: Secondary | ICD-10-CM

## 2022-07-05 NOTE — Progress Notes (Signed)
   Chief Complaint  Patient presents with   Callouses    Patient is here for callous on right foot and ingrown toenail on left foot.    HPI: 81 y.o. female presenting today as a new patient for evaluation of pain and tenderness associated to a callus that is developed to the medial aspect of the right great toe joint as well as possible ingrown toenail to the left great toe.  She states that both areas are sensitive.  She denies a history of injury.  She is not anything currently for treatment.  Past Medical History:  Diagnosis Date   Anxiety    Diverticulosis    GERD (gastroesophageal reflux disease)    Hiatal hernia    High triglycerides    Osteopenia    Thyroid cancer ([place])    at age 40    Past Surgical History:  Procedure Laterality Date   BREAST LUMPECTOMY Left    COLONOSCOPY     ESOPHAGOGASTRODUODENOSCOPY     THYROIDECTOMY     x 2 , first time only half removed and then went back and removed other half    Allergies  Allergen Reactions   Amoxicillin Nausea Only     Physical Exam: General: The patient is alert and oriented x3 in no acute distress.  Dermatology: Hyperkeratotic porokeratosis with a central nucleated core noted to the medial aspect of the first MTP joint right foot with associated tenderness to palpation.  There is also tenderness to palpation along the medial border of the left great toenail plate.  There is also subungual debris underneath the nail plate in the area that is very sensitive.  Vascular: Palpable pedal pulses bilaterally. Capillary refill within normal limits.  Negative for any significant edema or erythema  Neurological: Light touch and protective threshold grossly intact  Musculoskeletal Exam: No pedal deformities noted  Assessment: 1.  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  Subungual debris with mild ingrown toenail medial border left great toe   Plan of Care:  1. Patient evaluated.  2.  Excisional  debridement of the hyperkeratotic porokeratosis was performed using a combination of 312 scalpel and tissue nipper.  The patient felt immediate relief 3.  Today we are going to pursue conservative treatment of the ingrown toenail.  Mechanical debridement of the nail and removal of the subungual debris was performed using a nail nipper without incident or bleeding.  Patient states that she also felt relief from this area 4.  Return to clinic 2 weeks.  If the patient continues to have pain and tenderness especially to the ingrowing portion of the toenail we will perform partial nail matricectomy at this time.     Edrick Kins, DPM [facility]  Dr. Edrick Kins, DPM    [address], [place] [address]                Office [phone]  Fax [phone]

## 2022-07-19 ENCOUNTER — Ambulatory Visit: Payer: Medicare Other | Admitting: Podiatry

## 2022-08-31 ENCOUNTER — Ambulatory Visit (INDEPENDENT_AMBULATORY_CARE_PROVIDER_SITE_OTHER): Payer: Medicare Other | Admitting: Sports Medicine

## 2022-08-31 VITALS — BP 133/52 | Ht 64.75 in

## 2022-08-31 DIAGNOSIS — M2141 Flat foot [pes planus] (acquired), right foot: Secondary | ICD-10-CM

## 2022-08-31 DIAGNOSIS — M2140 Flat foot [pes planus] (acquired), unspecified foot: Secondary | ICD-10-CM | POA: Insufficient documentation

## 2022-08-31 DIAGNOSIS — M2142 Flat foot [pes planus] (acquired), left foot: Secondary | ICD-10-CM | POA: Diagnosis not present

## 2022-08-31 NOTE — Assessment & Plan Note (Signed)
Custom orthotics made for her today, as detailed below.  Patient ambulated and then today without any pain.  She may return to clinic for adjustments as needed.

## 2022-08-31 NOTE — Progress Notes (Unsigned)
   Established Patient Office Visit  Subjective   Patient ID: Helen Patterson, female    DOB: September 09, 1941  Age: 81 y.o. MRN: [id number]  No chief complaint on file.   Patient is here today for repeat custom orthotics to be made.  Patient has had a couple pairs made in the past.  She gets good relief with these.     Objective:     BP (!) 133/52   Ht 5' 4.75" (1.645 m)   BMI 22.97 kg/m   Physical Exam Vitals reviewed.  Constitutional:      General: She is not in acute distress.    Appearance: Normal appearance. She is not ill-appearing, toxic-appearing or diaphoretic.  Neurological:     Mental Status: She is alert.   Bilateral feet: She does have some obvious pes planus deformity and hallux valgus greater on the right than the left.  Collapse of the horizontal and longitudinal arch.  She ambulates with a neutral gait.     Assessment & Plan:   Problem List Items Addressed This Visit       Other   Flat foot - Primary    Custom orthotics made for her today, as detailed below.  Patient ambulated and then today without any pain.  She may return to clinic for adjustments as needed.      Patient was fitted for a standard, cushioned, semi-rigid orthotic. The orthotic was heated and afterward the patient stood on the orthotic base positioned on the orthotic stand. The patient was positioned in subtalar neutral position and 10 degrees of ankle dorsiflexion in a weight bearing stance. After completion of molding, a stable base was applied to the orthotic blank. The base was ground to a stable position for weight bearing. Size: 8 Base: Blue EVA Additional Posting and Padding: None The patient ambulated these, and they were very comfortable.   Return if symptoms worsen or fail to improve.    Elmore Guise, DO  Patient seen and evaluated with the sports medicine fellow.  I agree with the above plan of care.  Custom orthotics were created as above.  Patient found them to  be comfortable prior to leaving the office and her gait was noted to be neutral with orthotics in place.  She will return to the office as needed.

## 2023-02-17 ENCOUNTER — Other Ambulatory Visit: Payer: Self-pay | Admitting: Internal Medicine

## 2023-02-17 DIAGNOSIS — R1313 Dysphagia, pharyngeal phase: Secondary | ICD-10-CM

## 2023-02-28 ENCOUNTER — Ambulatory Visit
Admission: RE | Admit: 2023-02-28 | Discharge: 2023-02-28 | Disposition: A | Discharge status: Home / Self Care | Payer: Medicare Other | Source: Ambulatory Visit | Attending: Internal Medicine | Admitting: Internal Medicine

## 2023-02-28 DIAGNOSIS — R1313 Dysphagia, pharyngeal phase: Secondary | ICD-10-CM

## 2023-12-09 ENCOUNTER — Telehealth: Payer: Self-pay | Admitting: Nurse Practitioner

## 2023-12-09 NOTE — Telephone Encounter (Signed)
Line rings busy  

## 2023-12-09 NOTE — Telephone Encounter (Signed)
 Inbound call from patient requesting to speak with a nurse in regards to her having diverticulitis.   Please advise. Thank you

## 2024-01-23 NOTE — H&P (Signed)
 PREOPERATIVE H&P  Chief Complaint: LEFT PATELLA  HPI: Helen Patterson is a 83 y.o. female who presents with a diagnosis of LEFT PATELLA. Symptoms are rated as moderate to severe, and have been worsening.  This is significantly impairing activities of daily living.  She has elected for surgical management.   Past Medical History:  Diagnosis Date   Anxiety    Diverticulosis    GERD (gastroesophageal reflux disease)    Hiatal hernia    High triglycerides    Osteopenia    Thyroid cancer (HCC)    at age 52   Past Surgical History:  Procedure Laterality Date   BREAST LUMPECTOMY Left    COLONOSCOPY     ESOPHAGOGASTRODUODENOSCOPY     THYROIDECTOMY     x 2 , first time only half removed and then went back and removed other half   Social History   Socioeconomic History   Marital status: Single    Spouse name: Not on file   Number of children: 0   Years of education: Not on file   Highest education level: Not on file  Occupational History   Occupation: retired  Tobacco Use   Smoking status: Never   Smokeless tobacco: Never  Vaping Use   Vaping status: Never Used  Substance and Sexual Activity   Alcohol use: Yes    Comment: 4 oz  three-four x weekly   Drug use: Never   Sexual activity: Not on file  Other Topics Concern   Not on file  Social History Narrative   Not on file   Social Drivers of Health   Financial Resource Strain: Low Risk  (08/25/2023)   Received from [facility]   Overall Financial Resource Strain (CARDIA)    Difficulty of Paying Living Expenses: Not hard at all  Food Insecurity: No Food Insecurity (08/25/2023)   Received from [facility]   Hunger Vital Sign    Worried About Running Out of Food in the Last Year: Never true    Ran Out of Food in the Last Year: Never true  Transportation Needs: No Transportation Needs (08/25/2023)   Received from [facility] - Transportation    In the past  12 months, has lack of transportation kept you from medical appointments or from getting medications?: No    Lack of Transportation (Non-Medical): No  Physical Activity: Sufficiently Active (08/01/2023)   Received from [facility]   Exercise Vital Sign    Days of Exercise per Week: 7 days    Minutes of Exercise per Session: 30 min  Stress: No Stress Concern Present (08/01/2023)   Received from [facility] - Occupational Stress Questionnaire    Feeling of Stress : Only a little  Social Connections: Socially Integrated (08/01/2023)   Received from [facility]   Social Network    How would you rate your social network (family, work, friends)?: Good participation with social networks   Family History  Problem Relation Age of Onset   Uterine cancer Paternal Aunt    Allergies  Allergen Reactions   Amoxicillin Nausea Only   Prior to Admission medications   Medication Sig Start Date End Date Taking? Authorizing Provider  ARIPiprazole (ABILIFY) 2 MG tablet Take 1 mg by mouth daily. 04/11/17  Yes [provider]  b complex vitamins capsule Take 1 capsule by mouth every morning.   Yes [provider]  buPROPion ER [facility]  SR) 100 MG 12 hr tablet Take 50 mg by mouth every morning.   Yes [provider]  Cholecalciferol (VITAMIN D) 50 MCG (2000 UT) tablet Take 2,000 Units by mouth daily. 08/29/09  Yes [provider]  clomiPRAMINE (ANAFRANIL) 25 MG capsule Take 25 mg by mouth at bedtime. 09/07/19  Yes [provider]  Desoximetasone (TOPICORT) 0.25 % ointment Apply 1 Application topically 2 (two) times daily. 01/20/24  Yes [provider]  famotidine (PEPCID) 20 MG tablet Take 20 mg by mouth daily as needed for heartburn or indigestion.   Yes [provider]  FARXIGA 5 MG TABS tablet Take 5 mg by mouth daily.   Yes [provider]  fenofibrate 160 MG tablet Take 160 mg by mouth  every other day. 05/12/16  Yes [provider]  levothyroxine (SYNTHROID) 100 MCG tablet Take 100 mcg by mouth daily before breakfast. 08/16/19 01/23/24 Yes [provider]  NON FORMULARY Pt uses a c-pap nightly   Yes [provider]  traZODone (DESYREL) 50 MG tablet Take 16.6 mg by mouth at bedtime. 05/06/12  Yes [provider]  OVER THE COUNTER MEDICATION Probiotic gummies- take 2 daily    [provider]     Positive ROS: All other systems have been reviewed and were otherwise negative with the exception of those mentioned in the HPI and as above.  Physical Exam: General: Alert, no acute distress Cardiovascular: No pedal edema Respiratory: No cyanosis, no use of accessory musculature GI: No organomegaly, abdomen is soft and non-tender Skin: No lesions in the area of chief complaint Neurologic: Sensation intact distally Psychiatric: Patient is competent for consent with normal mood and affect Lymphatic: No axillary or cervical lymphadenopathy  MUSCULOSKELETAL: TTP left anterior knee peripatellar, knee ROM limited as patient wants to keep knee in extension as position of comfort, strength decreased, moderate effusion present, NVI    Imaging: xrays show a mildly displaced left patella fracture   Assessment: LEFT PATELLA  Plan: Plan for Procedure(s): OPEN REDUCTION INTERNAL FIXATION (ORIF) PATELLA  The risks benefits and alternatives were discussed with the patient including but not limited to the risks of nonoperative treatment, versus surgical intervention including infection, bleeding, nerve injury,  blood clots, cardiopulmonary complications, morbidity, mortality, among others, and they were willing to proceed.   Weightbearing: WBAT in brace vs NWB Orthopedic devices: Bledsoe Showering: POD 3 Dressing: reinforce PRN Medicines: ASA, Norco, Celebrex, Zofran  Discharge: surgery admit Follow up: 02/08/24 at 11:15am    Jenne Pane, PA-C Office [phone] 01/23/2024 5:25 PM

## 2024-01-24 NOTE — Progress Notes (Addendum)
 COVID Vaccine Completed:  Yes  Date of COVID positive in last 90 days:  PCP - Jorene Minors, MD Cardiologist -   Chest x-ray -  EKG -  Stress Test -  ECHO -  Cardiac Cath -  Pacemaker/ICD device last checked: Spinal Cord Stimulator:  Bowel Prep -   Sleep Study - Yes, +sleep apnea CPAP -   Fasting Blood Sugar -  Checks Blood Sugar _____ times a day  Last dose of GLP1 agonist-  N/A GLP1 instructions:  Hold 7 days before surgery    Helen Patterson  Last dose of SGLT-2 inhibitors-  01-27-24 SGLT-2 instructions:  Hold 3 days before surgery    Blood Thinner Instructions:  Last dose:   Time: Aspirin Instructions: Last Dose:  Activity level:  Can go up a flight of stairs and perform activities of daily living without stopping and without symptoms of chest pain or shortness of breath.  Able to exercise without symptoms  Unable to go up a flight of stairs without symptoms of     Anesthesia review:   Patient denies shortness of breath, fever, cough and chest pain at PAT appointment  Patient verbalized understanding of instructions that were given to them at the PAT appointment. Patient was also instructed that they will need to review over the PAT instructions again at home before surgery.

## 2024-01-24 NOTE — Patient Instructions (Addendum)
 SURGICAL WAITING ROOM VISITATION Patients having surgery or a procedure may have no more than 2 support people in the waiting area - these visitors may rotate.    Children under the age of 52 must have an adult with them who is not the patient.  Due to an increase in RSV and influenza rates and associated hospitalizations, children ages 20 and under may not visit patients in [facility] hospitals.   If the patient needs to stay at the hospital during part of their recovery, the visitor guidelines for inpatient rooms apply. Pre-op nurse will coordinate an appropriate time for 1 support person to accompany patient in pre-op.  This support person may not rotate.    Please refer to the [facility] website for the visitor guidelines for Inpatients (after your surgery is over and you are in a regular room).       Your procedure is scheduled on: 01-31-24   [facility]    Report to admitting at 9:00 AM   Call this number if you have problems the morning of surgery [phone]   Do not eat food :After Midnight.   After Midnight you may have the following liquids until 8:20 AM DAY OF SURGERY  Water Non-Citrus Juices (without pulp, NO RED-Apple, White grape, White cranberry) Black Coffee (NO MILK/CREAM OR CREAMERS, sugar ok)  Clear Tea (NO MILK/CREAM OR CREAMERS, sugar ok) regular and decaf                             Plain Jell-O (NO RED)                                           Fruit ices (not with fruit pulp, NO RED)                                     Popsicles (NO RED)                                                               Sports drinks like Gatorade (NO RED)                   The day of surgery:  Drink ONE (1) Pre-Surgery Clear Ensure or G2 by 8:20 AM the morning of surgery. Drink in one sitting. Do not sip.  This drink was given to you during your hospital  pre-op appointment visit. Nothing else to drink after completing the Pre-Surgery  Clear Ensure or G2.          If you have questions, please contact your surgeon's office.   FOLLOW ANY ADDITIONAL PRE OP INSTRUCTIONS YOU RECEIVED FROM YOUR SURGEON'S OFFICE!!!     Oral Hygiene is also important to reduce your risk of infection.                                    Remember - BRUSH YOUR TEETH THE MORNING OF SURGERY WITH YOUR  REGULAR TOOTHPASTE   Do NOT smoke after Midnight   Take these medicines the morning of surgery:    Aripiprazole   Bupropion   Famotidine   Fenofibrate   Levothyroxine  Stop all vitamins and herbal supplements 7 days before surgery  Hold Farxiga 3 days before surgery (do not take after 01-27-24)  Bring CPAP mask and tubing day of surgery.                              You may not have any metal on your body including hair pins, jewelry, and body piercing             Do not wear make-up, lotions, powders, perfumes, or deodorant  Do not wear nail polish including gel and S&S, artificial/acrylic nails, or any other type of covering on natural nails including finger and toenails. If you have artificial nails, gel coating, etc. that needs to be removed by a nail salon please have this removed prior to surgery or surgery may need to be canceled/ delayed if the surgeon/ anesthesia feels like they are unable to be safely monitored.   Do not shave  48 hours prior to surgery.         Do not bring valuables to the hospital. [facility] IS NOT RESPONSIBLE   FOR VALUABLES.   Contacts, dentures or bridgework may not be worn into surgery.   Bring small overnight bag day of surgery.   DO NOT BRING YOUR HOME MEDICATIONS TO THE HOSPITAL. PHARMACY WILL DISPENSE MEDICATIONS LISTED ON YOUR MEDICATION LIST TO YOU DURING YOUR ADMISSION IN THE HOSPITAL!    Special Instructions: Bring a copy of your healthcare power of attorney and living will documents the day of surgery if you haven't scanned them before.              Please read over the following fact sheets  you were given: IF YOU HAVE QUESTIONS ABOUT YOUR PRE-OP INSTRUCTIONS PLEASE CALL [phone] Helen Patterson  If you received a COVID test during your pre-op visit  it is requested that you wear a mask when out in public, stay away from anyone that may not be feeling well and notify your surgeon if you develop symptoms. If you test positive for Covid or have been in contact with anyone that has tested positive in the last 10 days please notify you surgeon.  [facility] - Preparing for Surgery Before surgery, you can play an important role.  Because skin is not sterile, your skin needs to be as free of germs as possible.  You can reduce the number of germs on your skin by washing with CHG (chlorahexidine gluconate) soap before surgery.  CHG is an antiseptic cleaner which kills germs and bonds with the skin to continue killing germs even after washing. Please DO NOT use if you have an allergy to CHG or antibacterial soaps.  If your skin becomes reddened/irritated stop using the CHG and inform your nurse when you arrive at Short Stay. Do not shave (including legs and underarms) for at least 48 hours prior to the first CHG shower.  You may shave your face/neck.  Please follow these instructions carefully:  1.  Shower with CHG Soap the night before surgery and the  morning of surgery.  2.  If you choose to wash your hair, wash your hair first as usual with your normal  shampoo.  3.  After you shampoo, rinse  your hair and body thoroughly to remove the shampoo.                             4.  Use CHG as you would any other liquid soap.  You can apply chg directly to the skin and wash.  Gently with a scrungie or clean washcloth.  5.  Apply the CHG Soap to your body ONLY FROM THE NECK DOWN.   Do   not use on face/ open                           Wound or open sores. Avoid contact with eyes, ears mouth and   genitals (private parts).                       Wash face,  Genitals (private parts) with your normal soap.              6.  Wash thoroughly, paying special attention to the area where your    surgery  will be performed.  7.  Thoroughly rinse your body with warm water from the neck down.  8.  DO NOT shower/wash with your normal soap after using and rinsing off the CHG Soap.                9.  Pat yourself dry with a clean towel.            10.  Wear clean pajamas.            11.  Place clean sheets on your bed the night of your first shower and do not  sleep with pets. Day of Surgery : Do not apply any lotions/deodorants the morning of surgery.  Please wear clean clothes to the hospital/surgery center.  FAILURE TO FOLLOW THESE INSTRUCTIONS MAY RESULT IN THE CANCELLATION OF YOUR SURGERY  PATIENT SIGNATURE_________________________________  NURSE SIGNATURE__________________________________  ________________________________________________________________________      Helen Patterson  An incentive spirometer is a tool that can help keep your lungs clear and active. This tool measures how well you are filling your lungs with each breath. Taking long deep breaths may help reverse or decrease the chance of developing breathing (pulmonary) problems (especially infection) following: A long period of time when you are unable to move or be active. BEFORE THE PROCEDURE  If the spirometer includes an indicator to show your best effort, your nurse or respiratory therapist will set it to a desired goal. If possible, sit up straight or lean slightly forward. Try not to slouch. Hold the incentive spirometer in an upright position. INSTRUCTIONS FOR USE  Sit on the edge of your bed if possible, or sit up as far as you can in bed or on a chair. Hold the incentive spirometer in an upright position. Breathe out normally. Place the mouthpiece in your mouth and seal your lips tightly around it. Breathe in slowly and as deeply as possible, raising the piston or the ball toward the top of the column. Hold your  breath for 3-5 seconds or for as long as possible. Allow the piston or ball to fall to the bottom of the column. Remove the mouthpiece from your mouth and breathe out normally. Rest for a few seconds and repeat Steps 1 through 7 at least 10 times every 1-2 hours when you are awake. Take your time and take a few  normal breaths between deep breaths. The spirometer may include an indicator to show your best effort. Use the indicator as a goal to work toward during each repetition. After each set of 10 deep breaths, practice coughing to be sure your lungs are clear. If you have an incision (the cut made at the time of surgery), support your incision when coughing by placing a pillow or rolled up towels firmly against it. Once you are able to get out of bed, walk around indoors and cough well. You may stop using the incentive spirometer when instructed by your caregiver.  RISKS AND COMPLICATIONS Take your time so you do not get dizzy or light-headed. If you are in pain, you may need to take or ask for pain medication before doing incentive spirometry. It is harder to take a deep breath if you are having pain. AFTER USE Rest and breathe slowly and easily. It can be helpful to keep track of a log of your progress. Your caregiver can provide you with a simple table to help with this. If you are using the spirometer at home, follow these instructions: SEEK MEDICAL CARE IF:  You are having difficultly using the spirometer. You have trouble using the spirometer as often as instructed. Your pain medication is not giving enough relief while using the spirometer. You develop fever of 100.5 F (38.1 C) or higher. SEEK IMMEDIATE MEDICAL CARE IF:  You cough up bloody sputum that had not been present before. You develop fever of 102 F (38.9 C) or greater. You develop worsening pain at or near the incision site. MAKE SURE YOU:  Understand these instructions. Will watch your condition. Will get help right  away if you are not doing well or get worse. Document Released: 03/28/2007 Document Revised: 02/07/2012 Document Reviewed: 05/29/2007 [facility] Patient Information 2014 [place], [place].   ________________________________________________________________________

## 2024-01-26 ENCOUNTER — Encounter (HOSPITAL_COMMUNITY)
Admission: RE | Admit: 2024-01-26 | Discharge: 2024-01-26 | Disposition: A | Discharge status: Home / Self Care | Payer: Medicare Other | Source: Ambulatory Visit | Attending: Orthopedic Surgery | Admitting: Orthopedic Surgery

## 2024-01-26 ENCOUNTER — Encounter (HOSPITAL_COMMUNITY): Payer: Self-pay

## 2024-01-26 ENCOUNTER — Other Ambulatory Visit: Payer: Self-pay

## 2024-01-26 VITALS — BP 149/68 | HR 91 | Temp 98.7°F | Resp 12 | Ht 64.5 in | Wt 130.4 lb

## 2024-01-26 DIAGNOSIS — N289 Disorder of kidney and ureter, unspecified: Secondary | ICD-10-CM | POA: Diagnosis not present

## 2024-01-26 DIAGNOSIS — Z01812 Encounter for preprocedural laboratory examination: Secondary | ICD-10-CM | POA: Diagnosis present

## 2024-01-26 DIAGNOSIS — Z01818 Encounter for other preprocedural examination: Secondary | ICD-10-CM

## 2024-01-26 HISTORY — DX: Depression, unspecified: F32.A

## 2024-01-26 HISTORY — DX: Chronic kidney disease, unspecified: N18.9

## 2024-01-26 LAB — BASIC METABOLIC PANEL
Anion gap: 9 (ref 5–15)
BUN: 20 mg/dL (ref 8–23)
CO2: 20 mmol/L — ABNORMAL LOW (ref 22–32)
Calcium: 8.9 mg/dL (ref 8.9–10.3)
Chloride: 103 mmol/L (ref 98–111)
Creatinine, Ser: 1.26 mg/dL — ABNORMAL HIGH (ref 0.44–1.00)
GFR, Estimated: 43 mL/min — ABNORMAL LOW (ref >60)
Glucose, Bld: 110 mg/dL — ABNORMAL HIGH (ref 70–99)
Potassium: 3.7 mmol/L (ref 3.5–5.1)
Sodium: 132 mmol/L — ABNORMAL LOW (ref 135–145)

## 2024-01-26 LAB — CBC
HCT: 31.6 % — ABNORMAL LOW (ref 36.0–46.0)
Hemoglobin: 10.5 g/dL — ABNORMAL LOW (ref 12.0–15.0)
MCH: 31.7 pg (ref 26.0–34.0)
MCHC: 33.2 g/dL (ref 30.0–36.0)
MCV: 95.5 fL (ref 80.0–100.0)
Platelets: 282 10*3/uL (ref 150–400)
RBC: 3.31 MIL/uL — ABNORMAL LOW (ref 3.87–5.11)
RDW: 13.2 % (ref 11.5–15.5)
WBC: 4.2 10*3/uL (ref 4.0–10.5)
nRBC: 0 % (ref 0.0–0.2)

## 2024-01-31 ENCOUNTER — Ambulatory Visit (HOSPITAL_COMMUNITY)
Admission: RE | Admit: 2024-01-31 | Discharge: 2024-02-01 | Disposition: A | Discharge status: Home / Self Care | Payer: Medicare Other | Source: Ambulatory Visit | Attending: Orthopedic Surgery | Admitting: Orthopedic Surgery

## 2024-01-31 ENCOUNTER — Other Ambulatory Visit: Payer: Self-pay

## 2024-01-31 ENCOUNTER — Inpatient Hospital Stay (HOSPITAL_COMMUNITY)

## 2024-01-31 ENCOUNTER — Inpatient Hospital Stay (HOSPITAL_COMMUNITY): Admitting: Anesthesiology

## 2024-01-31 ENCOUNTER — Encounter (HOSPITAL_COMMUNITY): Payer: Self-pay | Admitting: Orthopedic Surgery

## 2024-01-31 ENCOUNTER — Encounter (HOSPITAL_COMMUNITY)
Admission: RE | Disposition: A | Discharge status: Home / Self Care | Payer: Self-pay | Source: Ambulatory Visit | Attending: Orthopedic Surgery

## 2024-01-31 DIAGNOSIS — S82032A Displaced transverse fracture of left patella, initial encounter for closed fracture: Principal | ICD-10-CM

## 2024-01-31 DIAGNOSIS — S82002A Unspecified fracture of left patella, initial encounter for closed fracture: Secondary | ICD-10-CM

## 2024-01-31 DIAGNOSIS — Z7182 Exercise counseling: Secondary | ICD-10-CM | POA: Diagnosis not present

## 2024-01-31 DIAGNOSIS — X58XXXA Exposure to other specified factors, initial encounter: Secondary | ICD-10-CM | POA: Diagnosis not present

## 2024-01-31 HISTORY — PX: ORIF PATELLA: SHX5033

## 2024-01-31 LAB — CBC
HCT: 32 % — ABNORMAL LOW (ref 36.0–46.0)
Hemoglobin: 10.6 g/dL — ABNORMAL LOW (ref 12.0–15.0)
MCH: 32.3 pg (ref 26.0–34.0)
MCHC: 33.1 g/dL (ref 30.0–36.0)
MCV: 97.6 fL (ref 80.0–100.0)
Platelets: 307 10*3/uL (ref 150–400)
RBC: 3.28 MIL/uL — ABNORMAL LOW (ref 3.87–5.11)
RDW: 13.4 % (ref 11.5–15.5)
WBC: 6.1 10*3/uL (ref 4.0–10.5)
nRBC: 0 % (ref 0.0–0.2)

## 2024-01-31 LAB — CREATININE, SERUM
Creatinine, Ser: 1.13 mg/dL — ABNORMAL HIGH (ref 0.44–1.00)
GFR, Estimated: 49 mL/min — ABNORMAL LOW (ref >60)

## 2024-01-31 SURGERY — OPEN REDUCTION INTERNAL FIXATION (ORIF) PATELLA
Anesthesia: General | Site: Knee | Laterality: Left

## 2024-01-31 MED ORDER — PANTOPRAZOLE SODIUM 40 MG PO TBEC
40.0000 mg | DELAYED_RELEASE_TABLET | Freq: Every day | ORAL | Status: DC
  Administered 2024-02-01: 40 mg via ORAL
  Filled 2024-01-31: qty 1

## 2024-01-31 MED ORDER — FENTANYL CITRATE PF 50 MCG/ML IJ SOSY
100.0000 ug | PREFILLED_SYRINGE | INTRAMUSCULAR | Status: DC
  Administered 2024-01-31: 50 ug via INTRAVENOUS
  Filled 2024-01-31: qty 2

## 2024-01-31 MED ORDER — CLOMIPRAMINE HCL 25 MG PO CAPS
25.0000 mg | ORAL_CAPSULE | Freq: Every day | ORAL | Status: DC
  Administered 2024-01-31: 25 mg via ORAL
  Filled 2024-01-31 (×2): qty 1

## 2024-01-31 MED ORDER — METHOCARBAMOL 1000 MG/10ML IJ SOLN: 500.0000 mg | Freq: Four times a day (QID) | INTRAMUSCULAR | Status: DC | PRN

## 2024-01-31 MED ORDER — PROPOFOL 10 MG/ML IV BOLUS
INTRAVENOUS | Status: DC | PRN
  Administered 2024-01-31: 200 mg via INTRAVENOUS

## 2024-01-31 MED ORDER — ACETAMINOPHEN 500 MG PO TABS
500.0000 mg | ORAL_TABLET | Freq: Four times a day (QID) | ORAL | Status: AC
  Administered 2024-01-31 – 2024-02-01 (×4): 500 mg via ORAL
  Filled 2024-01-31 (×4): qty 1

## 2024-01-31 MED ORDER — LIDOCAINE HCL (PF) 2 % IJ SOLN
INTRAMUSCULAR | Status: AC
  Filled 2024-01-31: qty 5

## 2024-01-31 MED ORDER — CHLORHEXIDINE GLUCONATE 0.12 % MT SOLN
15.0000 mL | Freq: Once | OROMUCOSAL | Status: AC
  Administered 2024-01-31: 15 mL via OROMUCOSAL

## 2024-01-31 MED ORDER — PHENYLEPHRINE HCL-NACL 20-0.9 MG/250ML-% IV SOLN
INTRAVENOUS | Status: DC | PRN
  Administered 2024-01-31: 35 ug/min via INTRAVENOUS

## 2024-01-31 MED ORDER — DAPAGLIFLOZIN PROPANEDIOL 5 MG PO TABS
5.0000 mg | ORAL_TABLET | Freq: Every day | ORAL | Status: DC
  Administered 2024-02-01: 5 mg via ORAL
  Filled 2024-01-31: qty 1

## 2024-01-31 MED ORDER — ONDANSETRON HCL 4 MG/2ML IJ SOLN
INTRAMUSCULAR | Status: DC | PRN
  Administered 2024-01-31: 4 mg via INTRAVENOUS

## 2024-01-31 MED ORDER — LIDOCAINE HCL (PF) 2 % IJ SOLN
INTRAMUSCULAR | Status: DC | PRN
  Administered 2024-01-31: 40 mg via INTRADERMAL

## 2024-01-31 MED ORDER — CEFAZOLIN SODIUM-DEXTROSE 2-4 GM/100ML-% IV SOLN
2.0000 g | INTRAVENOUS | Status: AC
  Administered 2024-01-31: 2 g via INTRAVENOUS
  Filled 2024-01-31: qty 100

## 2024-01-31 MED ORDER — ARIPIPRAZOLE 2 MG PO TABS
1.0000 mg | ORAL_TABLET | Freq: Every day | ORAL | Status: DC
  Administered 2024-02-01: 1 mg via ORAL
  Filled 2024-01-31: qty 1

## 2024-01-31 MED ORDER — BUPIVACAINE HCL (PF) 0.5 % IJ SOLN
INTRAMUSCULAR | Status: AC
  Filled 2024-01-31: qty 30

## 2024-01-31 MED ORDER — MIDAZOLAM HCL 2 MG/2ML IJ SOLN
2.0000 mg | INTRAMUSCULAR | Status: DC
  Administered 2024-01-31: 1 mg via INTRAVENOUS
  Filled 2024-01-31: qty 2

## 2024-01-31 MED ORDER — ENOXAPARIN SODIUM 30 MG/0.3ML IJ SOSY
30.0000 mg | PREFILLED_SYRINGE | INTRAMUSCULAR | Status: DC
  Administered 2024-02-01: 30 mg via SUBCUTANEOUS
  Filled 2024-01-31: qty 0.3

## 2024-01-31 MED ORDER — 0.9 % SODIUM CHLORIDE (POUR BTL) OPTIME
TOPICAL | Status: DC | PRN
  Administered 2024-01-31: 1000 mL

## 2024-01-31 MED ORDER — ONDANSETRON HCL 4 MG PO TABS: 4.0000 mg | ORAL_TABLET | Freq: Four times a day (QID) | ORAL | Status: DC | PRN

## 2024-01-31 MED ORDER — DIPHENHYDRAMINE HCL 12.5 MG/5ML PO ELIX: 12.5000 mg | ORAL_SOLUTION | ORAL | Status: DC | PRN

## 2024-01-31 MED ORDER — MORPHINE SULFATE (PF) 2 MG/ML IV SOLN: 0.5000 mg | INTRAVENOUS | Status: DC | PRN

## 2024-01-31 MED ORDER — FENTANYL CITRATE PF 50 MCG/ML IJ SOSY: 25.0000 ug | PREFILLED_SYRINGE | INTRAMUSCULAR | Status: DC | PRN

## 2024-01-31 MED ORDER — HYDROCODONE-ACETAMINOPHEN 5-325 MG PO TABS
1.0000 | ORAL_TABLET | ORAL | Status: DC | PRN
  Administered 2024-01-31: 1 via ORAL
  Filled 2024-01-31 (×2): qty 1

## 2024-01-31 MED ORDER — DOCUSATE SODIUM 100 MG PO CAPS
100.0000 mg | ORAL_CAPSULE | Freq: Two times a day (BID) | ORAL | Status: DC
  Administered 2024-01-31 – 2024-02-01 (×2): 100 mg via ORAL
  Filled 2024-01-31 (×2): qty 1

## 2024-01-31 MED ORDER — BUPIVACAINE-EPINEPHRINE (PF) 0.5% -1:200000 IJ SOLN
INTRAMUSCULAR | Status: DC | PRN
Start: 2024-01-31 — End: 2024-01-31
  Administered 2024-01-31: 30 mL via PERINEURAL

## 2024-01-31 MED ORDER — LEVOTHYROXINE SODIUM 100 MCG PO TABS
100.0000 ug | ORAL_TABLET | Freq: Every day | ORAL | Status: DC
Start: 2024-02-01 — End: 2024-02-01
  Administered 2024-02-01: 100 ug via ORAL
  Filled 2024-01-31: qty 1

## 2024-01-31 MED ORDER — BISACODYL 10 MG RE SUPP: 10.0000 mg | Freq: Every day | RECTAL | Status: DC | PRN

## 2024-01-31 MED ORDER — ORAL CARE MOUTH RINSE: 15.0000 mL | Freq: Once | OROMUCOSAL | Status: AC

## 2024-01-31 MED ORDER — POLYETHYLENE GLYCOL 3350 17 G PO PACK: 17.0000 g | PACK | Freq: Every day | ORAL | Status: DC | PRN

## 2024-01-31 MED ORDER — PROPOFOL 10 MG/ML IV BOLUS
INTRAVENOUS | Status: AC
  Filled 2024-01-31: qty 20

## 2024-01-31 MED ORDER — ONDANSETRON HCL 4 MG/2ML IJ SOLN: 4.0000 mg | Freq: Four times a day (QID) | INTRAMUSCULAR | Status: DC | PRN

## 2024-01-31 MED ORDER — POVIDONE-IODINE 10 % EX SWAB: 2.0000 | Freq: Once | CUTANEOUS | Status: DC

## 2024-01-31 MED ORDER — TRANEXAMIC ACID-NACL 1000-0.7 MG/100ML-% IV SOLN
INTRAVENOUS | Status: AC
  Filled 2024-01-31: qty 100

## 2024-01-31 MED ORDER — CELECOXIB 200 MG PO CAPS
200.0000 mg | ORAL_CAPSULE | Freq: Two times a day (BID) | ORAL | Status: DC
  Administered 2024-01-31: 200 mg via ORAL
  Filled 2024-01-31 (×2): qty 1

## 2024-01-31 MED ORDER — FENTANYL CITRATE (PF) 100 MCG/2ML IJ SOLN
INTRAMUSCULAR | Status: DC | PRN
  Administered 2024-01-31 (×4): 25 ug via INTRAVENOUS

## 2024-01-31 MED ORDER — METOCLOPRAMIDE HCL 5 MG/ML IJ SOLN: 5.0000 mg | Freq: Three times a day (TID) | INTRAMUSCULAR | Status: DC | PRN

## 2024-01-31 MED ORDER — MAGNESIUM CITRATE PO SOLN: 1.0000 | Freq: Once | ORAL | Status: DC | PRN

## 2024-01-31 MED ORDER — CEFAZOLIN SODIUM-DEXTROSE 2-4 GM/100ML-% IV SOLN
2.0000 g | Freq: Four times a day (QID) | INTRAVENOUS | Status: AC
  Administered 2024-01-31 – 2024-02-01 (×2): 2 g via INTRAVENOUS
  Filled 2024-01-31 (×2): qty 100

## 2024-01-31 MED ORDER — TRANEXAMIC ACID-NACL 1000-0.7 MG/100ML-% IV SOLN
1000.0000 mg | INTRAVENOUS | Status: AC
  Administered 2024-01-31: 1000 mg via INTRAVENOUS

## 2024-01-31 MED ORDER — FAMOTIDINE 20 MG PO TABS: 20.0000 mg | ORAL_TABLET | Freq: Every day | ORAL | Status: DC | PRN

## 2024-01-31 MED ORDER — LIDOCAINE 2% (20 MG/ML) 5 ML SYRINGE: INTRAMUSCULAR | Status: DC | PRN

## 2024-01-31 MED ORDER — HYDROCODONE-ACETAMINOPHEN 7.5-325 MG PO TABS: 1.0000 | ORAL_TABLET | ORAL | Status: DC | PRN

## 2024-01-31 MED ORDER — LACTATED RINGERS IV SOLN: INTRAVENOUS | Status: DC

## 2024-01-31 MED ORDER — FENTANYL CITRATE (PF) 100 MCG/2ML IJ SOLN
INTRAMUSCULAR | Status: AC
  Filled 2024-01-31: qty 2

## 2024-01-31 MED ORDER — ONDANSETRON HCL 4 MG/2ML IJ SOLN
INTRAMUSCULAR | Status: AC
Start: 2024-01-31 — End: ?
  Filled 2024-01-31: qty 2

## 2024-01-31 MED ORDER — ACETAMINOPHEN 500 MG PO TABS
1000.0000 mg | ORAL_TABLET | Freq: Once | ORAL | Status: AC
  Administered 2024-01-31: 1000 mg via ORAL
  Filled 2024-01-31: qty 2

## 2024-01-31 MED ORDER — METOCLOPRAMIDE HCL 5 MG PO TABS: 5.0000 mg | ORAL_TABLET | Freq: Three times a day (TID) | ORAL | Status: DC | PRN

## 2024-01-31 MED ORDER — METHOCARBAMOL 500 MG PO TABS: 500.0000 mg | ORAL_TABLET | Freq: Four times a day (QID) | ORAL | Status: DC | PRN

## 2024-01-31 SURGICAL SUPPLY — 52 items
BAG COUNTER SPONGE SURGICOUNT (BAG) ×1 IMPLANT
BANDAGE ESMARK 6X9 LF (GAUZE/BANDAGES/DRESSINGS) ×1 IMPLANT
BIT DRILL 2.6 CANN (BIT) IMPLANT
BLADE SURG 15 STRL LF DISP TIS (BLADE) ×2 IMPLANT
BNDG COHESIVE 4X5 TAN STRL LF (GAUZE/BANDAGES/DRESSINGS) IMPLANT
BNDG COHESIVE 6X5 TAN ST LF (GAUZE/BANDAGES/DRESSINGS) IMPLANT
BNDG ELASTIC 4INX 5YD STR LF (GAUZE/BANDAGES/DRESSINGS) ×1 IMPLANT
BNDG ELASTIC 6X10 VLCR STRL LF (GAUZE/BANDAGES/DRESSINGS) IMPLANT
BNDG ESMARK 6X9 LF (GAUZE/BANDAGES/DRESSINGS) ×1 IMPLANT
BNDG PLASTER X FAST 5X5 WHT LF (CAST SUPPLIES) IMPLANT
CHLORAPREP W/TINT 26 (MISCELLANEOUS) ×1 IMPLANT
COVER MAYO STAND STRL (DRAPES) IMPLANT
CUFF TRNQT CYL 24X4X16.5-23 (TOURNIQUET CUFF) IMPLANT
CUFF TRNQT CYL 34X4.125X (TOURNIQUET CUFF) IMPLANT
DRAPE OEC MINIVIEW 54X84 (DRAPES) ×1 IMPLANT
DRAPE TOP 10253 STERILE (DRAPES) IMPLANT
DRAPE U-SHAPE 47X51 STRL (DRAPES) IMPLANT
DRSG EMULSION OIL 3X3 NADH (GAUZE/BANDAGES/DRESSINGS) IMPLANT
DRSG MEPILEX POST OP 4X8 (GAUZE/BANDAGES/DRESSINGS) IMPLANT
ELECT REM PT RETURN 15FT ADLT (MISCELLANEOUS) ×1 IMPLANT
FIBERTAPE 2 W/STRL NDL 17 (SUTURE) IMPLANT
GAUZE SPONGE 4X4 12PLY STRL (GAUZE/BANDAGES/DRESSINGS) ×1 IMPLANT
GLOVE BIO SURGEON STRL SZ7.5 (GLOVE) ×2 IMPLANT
GLOVE BIOGEL PI IND STRL 7.5 (GLOVE) ×1 IMPLANT
GLOVE BIOGEL PI IND STRL 8 (GLOVE) ×2 IMPLANT
GOWN STRL REUS W/ TWL LRG LVL3 (GOWN DISPOSABLE) ×1 IMPLANT
GUIDEWIRE 1.35MM DUAL TROCAR (WIRE) IMPLANT
IMMOBILIZER KNEE 22 UNIV (SOFTGOODS) IMPLANT
KIT BASIN OR (CUSTOM PROCEDURE TRAY) ×1 IMPLANT
KIT TURNOVER KIT A (KITS) IMPLANT
NDL HYPO 22X1.5 SAFETY MO (MISCELLANEOUS) IMPLANT
NEEDLE HYPO 22X1.5 SAFETY MO (MISCELLANEOUS) IMPLANT
NS IRRIG 1000ML POUR BTL (IV SOLUTION) ×1 IMPLANT
PACK ORTHO EXTREMITY (CUSTOM PROCEDURE TRAY) IMPLANT
PAD CAST 4YDX4 CTTN HI CHSV (CAST SUPPLIES) ×1 IMPLANT
PENCIL SMOKE EVACUATOR (MISCELLANEOUS) ×1 IMPLANT
SCREW BLUNT TIP 4MMX32 (Screw) IMPLANT
SPIKE FLUID TRANSFER (MISCELLANEOUS) IMPLANT
SPONGE T-LAP 4X18 ~~LOC~~+RFID (SPONGE) ×1 IMPLANT
STRIP CLOSURE SKIN 1/2X4 (GAUZE/BANDAGES/DRESSINGS) ×1 IMPLANT
SUCTION TUBE FRAZIER 10FR DISP (SUCTIONS) IMPLANT
SUT ETHILON 3 0 PS 1 (SUTURE) IMPLANT
SUT MNCRL AB 3-0 PS2 18 (SUTURE) IMPLANT
SUT MON AB 2-0 CT1 36 (SUTURE) ×1 IMPLANT
SUT VIC AB 0 CT1 27XBRD ANBCTR (SUTURE) ×2 IMPLANT
SUT VIC AB 0 CT1 36 (SUTURE) IMPLANT
SUT VIC AB 2-0 SH 27XBRD (SUTURE) IMPLANT
SYR BULB EAR ULCER 3OZ GRN STR (SYRINGE) ×1 IMPLANT
TUBING CONNECTING 10 (TUBING) IMPLANT
UNDERPAD 30X36 HEAVY ABSORB (UNDERPADS AND DIAPERS) ×1 IMPLANT
WASHER ORTHO 7X (Orthopedic Implant) IMPLANT
YANKAUER SUCT BULB TIP NO VENT (SUCTIONS) ×1 IMPLANT

## 2024-01-31 NOTE — Plan of Care (Signed)

## 2024-01-31 NOTE — Anesthesia Procedure Notes (Signed)
 Procedure Name: LMA Insertion Date/Time: 01/31/2024 11:19 AM  Performed by: Orest Dikes, CRNAPre-anesthesia Checklist: Patient identified, Emergency Drugs available, Suction available and Patient being monitored Patient Re-evaluated:Patient Re-evaluated prior to induction Oxygen Delivery Method: Circle system utilized Preoxygenation: Pre-oxygenation with 100% oxygen Induction Type: IV induction LMA: LMA with gastric port inserted LMA Size: 4.0 Placement Confirmation: positive ETCO2 and breath sounds checked- equal and bilateral Tube secured with: Tape Dental Injury: Teeth and Oropharynx as per pre-operative assessment

## 2024-01-31 NOTE — Anesthesia Procedure Notes (Signed)
 Anesthesia Regional Block: Femoral nerve block   Pre-Anesthetic Checklist: , timeout performed,  Correct Patient, Correct Site, Correct Laterality,  Correct Procedure, Correct Position, site marked,  Risks and benefits discussed,  Surgical consent,  Pre-op evaluation,  At surgeon's request and post-op pain management  Laterality: Left  Prep: chloraprep       Needles:  Injection technique: Single-shot  Needle Type: Echogenic Stimulator Needle     Needle Length: 9cm  Needle Gauge: 21     Additional Needles:   Procedures:,,,, ultrasound used (permanent image in chart),,    Narrative:  Start time: 01/31/2024 10:25 AM End time: 01/31/2024 10:30 AM Injection made incrementally with aspirations every 5 mL.  Performed by: Personally  Anesthesiologist: Shelton Silvas, MD  Additional Notes: Discussed risks and benefits of the nerve block in detail, including but not limited vascular injury, permanent nerve damage and infection.   Patient tolerated the procedure well. Local anesthetic introduced in an incremental fashion under minimal resistance after negative aspirations. No paresthesias were elicited. After completion of the procedure, no acute issues were identified and patient continued to be monitored by RN.

## 2024-01-31 NOTE — Interval H&P Note (Signed)
 History and Physical Interval Note:  01/31/2024 9:30 AM  Helen Patterson  has presented today for surgery, with the diagnosis of LEFT PATELLA.  The various methods of treatment have been discussed with the patient and family. After consideration of risks, benefits and other options for treatment, the patient has consented to  Procedure(s): OPEN REDUCTION INTERNAL FIXATION (ORIF) PATELLA (Left) as a surgical intervention.  The patient's history has been reviewed, patient examined, no change in status, stable for surgery.  I have reviewed the patient's chart and labs.  Questions were answered to the patient's satisfaction.     Sheral Apley

## 2024-01-31 NOTE — Transfer of Care (Signed)
 Immediate Anesthesia Transfer of Care Note  Patient: Helen Patterson  Procedure(s) Performed: OPEN REDUCTION INTERNAL FIXATION (ORIF) PATELLA (Left: Knee)  Patient Location: PACU  Anesthesia Type:General  Level of Consciousness: awake, alert , and oriented  Airway & Oxygen Therapy: Patient Spontanous Breathing and Patient connected to face mask oxygen  Post-op Assessment: Report given to RN and Post -op Vital signs reviewed and stable  Post vital signs: Reviewed and stable  Last Vitals:  Vitals Value Taken Time  BP    Temp    Pulse 92 01/31/24 1235  Resp 16 01/31/24 1235  SpO2 100 % 01/31/24 1235  Vitals shown include unfiled device data.  Last Pain:  Vitals:   01/31/24 1022  TempSrc:   PainSc: 0-No pain         Complications: No notable events documented.

## 2024-01-31 NOTE — Op Note (Signed)
 01/31/2024  12:07 PM  PATIENT:  Helen Patterson    PRE-OPERATIVE DIAGNOSIS:  LEFT PATELLA  POST-OPERATIVE DIAGNOSIS:  Same  PROCEDURE:  OPEN REDUCTION INTERNAL FIXATION (ORIF) PATELLA  SURGEON:  Sheral Apley, MD  PHYSICIAN ASSISTANT: Levester Fresh, PA-C, he was present and scrubbed throughout the case, critical for completion in a timely fashion, and for retraction, instrumentation, and closure.   ANESTHESIA:   General  PREOPERATIVE INDICATIONS:  Helen Patterson is a  83 y.o. female with a diagnosis of LEFT PATELLA who elected for surgical management in order to restore the function of the extensor mechanism.    The risks benefits and alternatives were discussed with the patient preoperatively including but not limited to the risks of infection, bleeding, nerve injury, cardiopulmonary complications, the need for revision surgery, hardware prominence, hardware failure, the need for hardware removal, nonunion, malunion, posttraumatic arthritis, stiffness, loss of strength and function, among others, and the patient was willing to proceed.  OPERATIVE IMPLANTS: two arthrex cannulated screws x2 with a total of 2 #2 FiberWire going through the cannulated screws in a figure-of-eight cerclage fashion  OPERATIVE FINDINGS: Displaced patella fracture  OPERATIVE PROCEDURE: The patient was brought to the operating room and placed in the supine position. General anesthesia was administered. IV antibiotics were given. The lower extremity was prepped and draped in usual sterile fashion. The leg was elevated and exsanguinated and the tourniquet was inflated. Time out was performed.   Anterior incision was made over the patella and the fracture fragments identified and cleaned of hematoma. The retinaculum was torn on either side.  I reduced the fracture anatomically and held provisionally with a clamp and placed 2 guidewires for the cannulated screws.  The lengths were measured, after being confirmed  on C-arm, and then I placed the screws, taking care to make sure that there were threads only on the proximal segment, providing compression at the fracture site, these were bicortical blunt screws.  C-arm used to confirm reduction and position of the screws, and once I was satisfied with this I then used a Keith needle through the screws bringing a fibertape in a figure-of-eight type fashion. This provided excellent secondary fixation. I left the screw lengths slightly short of the far cortex in order to minimize the risk for rupture of the FiberWire over the tip of the screws.  The wounds were irrigated copiously.  I performed a repair of the collateral joint capsul and extensor retinaculum that had ruptured on the medial side. I was happy with this closure.   I repaired the collateral joint capsul and retinaculum on the lateral side. I was happy with this apposition as well  I used Vicryl for the subcutaneous tissue with Steri-Strips and sterile gauze for the skin. The wounds were also injected. A knee immobilizer was applied. The patient was awakened and returned to the PACU in stable and satisfactory condition. There were no complications.   POSTOPERATIVE PLAN: WBAT in knee immobilizer, DVT px: ASA

## 2024-01-31 NOTE — Discharge Instructions (Signed)

## 2024-01-31 NOTE — Interval H&P Note (Signed)
 History and Physical Interval Note:  01/31/2024 8:38 AM  Helen Patterson  has presented today for surgery, with the diagnosis of LEFT PATELLA.  The various methods of treatment have been discussed with the patient and family. After consideration of risks, benefits and other options for treatment, the patient has consented to  Procedure(s): OPEN REDUCTION INTERNAL FIXATION (ORIF) PATELLA (Left) as a surgical intervention.  The patient's history has been reviewed, patient examined, no change in status, stable for surgery.  I have reviewed the patient's chart and labs.  Questions were answered to the patient's satisfaction.     Sheral Apley

## 2024-01-31 NOTE — Anesthesia Preprocedure Evaluation (Addendum)
 Anesthesia Evaluation  Patient identified by MRN, date of birth, ID band Patient awake    Reviewed: Allergy & Precautions, NPO status , Patient's Chart, lab work & pertinent test results  Airway Mallampati: II  TM Distance: >3 FB Neck ROM: Full    Dental  (+) Dental Advisory Given, Chipped,    Pulmonary neg pulmonary ROS   breath sounds clear to auscultation       Cardiovascular negative cardio ROS  Rhythm:Regular Rate:Normal     Neuro/Psych  PSYCHIATRIC DISORDERS Anxiety Depression     Neuromuscular disease    GI/Hepatic Neg liver ROS, hiatal hernia,GERD  Medicated,,  Endo/Other  negative endocrine ROS    Renal/GU Renal disease     Musculoskeletal negative musculoskeletal ROS (+)    Abdominal   Peds  Hematology negative hematology ROS (+)   Anesthesia Other Findings   Reproductive/Obstetrics                             Anesthesia Physical Anesthesia Plan  ASA: 2  Anesthesia Plan: General   Post-op Pain Management: Toradol IV (intra-op)*, Tylenol PO (pre-op)* and Regional block*   Induction: Intravenous  PONV Risk Score and Plan: 4 or greater and Ondansetron and Treatment may vary due to age or medical condition  Airway Management Planned: LMA  Additional Equipment: None  Intra-op Plan:   Post-operative Plan: Extubation in OR  Informed Consent: I have reviewed the patients History and Physical, chart, labs and discussed the procedure including the risks, benefits and alternatives for the proposed anesthesia with the patient or authorized representative who has indicated his/her understanding and acceptance.       Plan Discussed with: CRNA  Anesthesia Plan Comments:        Anesthesia Quick Evaluation

## 2024-01-31 NOTE — Anesthesia Postprocedure Evaluation (Signed)
 Anesthesia Post Note  Patient: Ellin Fitzgibbons  Procedure(s) Performed: OPEN REDUCTION INTERNAL FIXATION (ORIF) PATELLA (Left: Knee)     Patient location during evaluation: PACU Anesthesia Type: General Level of consciousness: awake and alert Pain management: pain level controlled Vital Signs Assessment: post-procedure vital signs reviewed and stable Respiratory status: spontaneous breathing, nonlabored ventilation, respiratory function stable and patient connected to nasal cannula oxygen Cardiovascular status: blood pressure returned to baseline and stable Postop Assessment: no apparent nausea or vomiting Anesthetic complications: no  No notable events documented.  Last Vitals:  Vitals:   01/31/24 1315 01/31/24 1330  BP: (!) 177/83 (!) 177/85  Pulse: 71 71  Resp: 11 11  Temp:  36.6 C  SpO2: 100% 100%    Last Pain:  Vitals:   01/31/24 1330  TempSrc:   PainSc: 2                  Shelton Silvas

## 2024-01-31 NOTE — Progress Notes (Signed)
   01/31/24 2246  BiPAP/CPAP/SIPAP  BiPAP/CPAP/SIPAP Pt Type Adult  BiPAP/CPAP/SIPAP Resmed  Mask Type Nasal pillows  Dentures removed? Not applicable  Mask Size Small  EPAP 5 cmH2O (per pt)  Patient Home Equipment Yes (home mask and tubing)  Auto Titrate No

## 2024-02-01 ENCOUNTER — Encounter (HOSPITAL_COMMUNITY): Payer: Self-pay | Admitting: Orthopedic Surgery

## 2024-02-01 DIAGNOSIS — S82002A Unspecified fracture of left patella, initial encounter for closed fracture: Secondary | ICD-10-CM | POA: Diagnosis not present

## 2024-02-01 LAB — BASIC METABOLIC PANEL
Anion gap: 9 (ref 5–15)
BUN: 22 mg/dL (ref 8–23)
CO2: 22 mmol/L (ref 22–32)
Calcium: 8.6 mg/dL — ABNORMAL LOW (ref 8.9–10.3)
Chloride: 103 mmol/L (ref 98–111)
Creatinine, Ser: 1.26 mg/dL — ABNORMAL HIGH (ref 0.44–1.00)
GFR, Estimated: 43 mL/min — ABNORMAL LOW (ref >60)
Glucose, Bld: 102 mg/dL — ABNORMAL HIGH (ref 70–99)
Potassium: 4 mmol/L (ref 3.5–5.1)
Sodium: 134 mmol/L — ABNORMAL LOW (ref 135–145)

## 2024-02-01 MED ORDER — ASPIRIN 81 MG PO TBEC
81.0000 mg | DELAYED_RELEASE_TABLET | Freq: Two times a day (BID) | ORAL | 0 refills | Status: AC
Start: 2024-02-01 — End: ?

## 2024-02-01 MED ORDER — CELECOXIB 200 MG PO CAPS: 200.0000 mg | ORAL_CAPSULE | Freq: Two times a day (BID) | ORAL | 0 refills | Status: AC

## 2024-02-01 MED ORDER — ONDANSETRON 4 MG PO TBDP: 4.0000 mg | ORAL_TABLET | Freq: Three times a day (TID) | ORAL | 0 refills | Status: AC | PRN

## 2024-02-01 MED ORDER — HYDROCODONE-ACETAMINOPHEN 10-325 MG PO TABS: 1.0000 | ORAL_TABLET | Freq: Four times a day (QID) | ORAL | 0 refills | Status: AC | PRN

## 2024-02-01 NOTE — Discharge Summary (Signed)
 Physician Discharge Summary  Patient ID: Helen Patterson MRN: [id number] DOB/AGE: Feb 06, 1941 83 y.o.  Admit date: 01/31/2024 Discharge date: 02/01/2024  Admission Diagnoses:  Left patella fracture  Discharge Diagnoses:  Principal Problem:   Left patella fracture   Past Medical History:  Diagnosis Date   Anxiety    Chronic kidney disease    Depression    Diverticulosis    GERD (gastroesophageal reflux disease)    Hiatal hernia    High triglycerides    Osteopenia    Thyroid cancer (HCC)    at age 43    Surgeries: Procedure(s): OPEN REDUCTION INTERNAL FIXATION (ORIF) PATELLA on 01/31/2024   Consultants (if any):   Discharged Condition: Improved  Hospital Course: Muranda Coye is an 83 y.o. female who was admitted 01/31/2024 with a diagnosis of Left patella fracture and went to the operating room on 01/31/2024 and underwent the above named procedures.    She was given perioperative antibiotics:  Anti-infectives (From admission, onward)    Start     Dose/Rate Route Frequency Ordered Stop   01/31/24 1800  ceFAZolin (ANCEF) IVPB 2g/100 mL premix        2 g 200 mL/hr over 30 Minutes Intravenous Every 6 hours 01/31/24 1556 02/01/24 0037   01/31/24 0845  ceFAZolin (ANCEF) IVPB 2g/100 mL premix        2 g 200 mL/hr over 30 Minutes Intravenous On call to O.R. 01/31/24 4098 01/31/24 1121     .  She was given sequential compression devices, early ambulation, and Lovenox for DVT prophylaxis.  She benefited maximally from the hospital stay and there were no complications.    Recent vital signs:  Vitals:   02/01/24 0606 02/01/24 1000  BP: (!) 152/71 (!) 151/73  Pulse: 83 90  Resp: 17 18  Temp: (!) 97.5 F (36.4 C) 98.5 F (36.9 C)  SpO2: 100% 100%    Recent laboratory studies:  Lab Results  Component Value Date   HGB 10.6 (L) 01/31/2024   HGB 10.5 (L) 01/26/2024   Lab Results  Component Value Date   WBC 6.1 01/31/2024   PLT 307 01/31/2024   No results found for:  "INR" Lab Results  Component Value Date   NA 134 (L) 02/01/2024   K 4.0 02/01/2024   CL 103 02/01/2024   CO2 22 02/01/2024   BUN 22 02/01/2024   CREATININE 1.26 (H) 02/01/2024   GLUCOSE 102 (H) 02/01/2024    Discharge Medications:   Allergies as of 02/01/2024       Reactions   Amoxicillin Nausea Only        Medication List     TAKE these medications    ARIPiprazole 2 MG tablet Commonly known as: ABILIFY Take 1 mg by mouth daily.   aspirin EC 81 MG tablet Take 1 tablet (81 mg total) by mouth 2 (two) times daily. To prevent blood clots for 30 days after surgery.   b complex vitamins capsule Take 1 capsule by mouth every morning.   buPROPion ER 100 MG 12 hr tablet Commonly known as: WELLBUTRIN SR Take 50 mg by mouth every morning.   celecoxib 200 MG capsule Commonly known as: CeleBREX Take 1 capsule (200 mg total) by mouth 2 (two) times daily for 14 days. For 2 weeks post op for pain and inflammation.  Discontinue Ibuprofen or other Anti-inflammatory medicine when taking this medicine.   clomiPRAMINE 25 MG capsule Commonly known as: ANAFRANIL Take 25 mg by mouth at bedtime.  Desoximetasone 0.25 % ointment Commonly known as: TOPICORT Apply 1 Application topically 2 (two) times daily.   famotidine 20 MG tablet Commonly known as: PEPCID Take 20 mg by mouth daily as needed for heartburn or indigestion.   Farxiga 5 MG Tabs tablet Generic drug: dapagliflozin propanediol Take 5 mg by mouth daily.   fenofibrate 160 MG tablet Take 160 mg by mouth every other day.   HYDROcodone-acetaminophen 10-325 MG tablet Commonly known as: NORCO Take 1-2 tablets by mouth every 6 (six) hours as needed for severe pain (pain score 7-10).   levothyroxine 100 MCG tablet Commonly known as: SYNTHROID Take 100 mcg by mouth daily before breakfast.   NON FORMULARY Pt uses a c-pap nightly   ondansetron 4 MG disintegrating tablet Commonly known as: ZOFRAN-ODT Take 1 tablet (4 mg  total) by mouth every 8 (eight) hours as needed for nausea or vomiting.   OVER THE COUNTER MEDICATION Probiotic gummies- take 2 daily   traZODone 50 MG tablet Commonly known as: DESYREL Take 16.6 mg by mouth at bedtime.   Vitamin D 50 MCG (2000 UT) tablet Take 2,000 Units by mouth daily.               Discharge Care Instructions  (From admission, onward)           Start     Ordered   02/01/24 0000  Weight bearing as tolerated       Comments: While in knee brace locked in full extension  Question Answer Comment  Laterality left   Extremity Lower      02/01/24 1533   02/01/24 0000  Non weight bearing       Comments: If out of knee brace  Question Answer Comment  Laterality left   Extremity Lower      02/01/24 1533            Diagnostic Studies: No results found.  Disposition: Discharge disposition: 01-Home or Self Care       Discharge Instructions     Call MD / Call 911   Complete by: As directed    If you experience chest pain or shortness of breath, CALL 911 and be transported to the hospital emergency room.  If you develope a fever above 101 F, pus (white drainage) or increased drainage or redness at the wound, or calf pain, call your surgeon's office.   Diet - low sodium heart healthy   Complete by: As directed    Discharge instructions   Complete by: As directed    Elevate leg and apply ice to reduce pain and swelling. It is not uncommon to have pain, swelling, and bruising to the entire leg from the thigh to the toes. Sometimes this can also cause temporary numbness that will get better as swelling decreases.    Maintain knee brace at all times until follow up.  Diet: As you were doing prior to hospitalization   Dressing:  Keep dressings on and dry until follow up. DO NOT REMOVE THE SURGICAL BANDAGE!  Activity:  Increase activity slowly as tolerated, but follow the weight bearing instructions below.  The rules on driving is that you can  not be taking narcotics while you drive, and you must feel in control of the vehicle.    Weight Bearing:   As tolerated while wearing knee brace locked in full extension. Non-weight bearing if out of brace. You can use crutches to help with mobilization.  Medicines: - Tylenol is for mild to  moderate pain relief. - Celebrex is for pain and inflammation. - Oxycodone is a narcotic for severe pain relief. Take this as little as possible and stop it as soon as possible.  - Zofran is for nausea and vomiting. - Aspirin is for prevent blood clots after surgery. YOU MUST TAKE THIS MEDICINE!!  To prevent constipation: you may use a stool softener such as -  Colace (over the counter) 100 mg by mouth twice a day  Drink plenty of fluids (prune juice may be helpful) and high fiber foods Miralax (over the counter) for constipation as needed.    Itching:  If you experience itching with your medications, try taking only a single pain pill, or even half a pain pill at a time.  You can also use benadryl over the counter for itching or also to help with sleep.   Precautions:  If you experience chest pain or shortness of breath - call 911 immediately for transfer to the hospital emergency department!!  If you develop a fever greater that 101 F, purulent drainage from wound, increased redness or drainage from wound, or calf pain -- Call the office at [phone]                                                 Follow- Up Appointment:  Please call for an appointment to be seen in 2 weeks - [phone]   Do not put a pillow under the knee. Place it under the heel.   Complete by: As directed    Driving restrictions   Complete by: As directed    No driving for 2-6 weeks   Non weight bearing   Complete by: As directed    If out of knee brace   Laterality: left   Extremity: Lower   Post-operative opioid taper instructions:   Complete by: As directed    POST-OPERATIVE OPIOID TAPER INSTRUCTIONS: It is  important to wean off of your opioid medication as soon as possible. If you do not need pain medication after your surgery it is ok to stop day one. Opioids include: Codeine, Hydrocodone(Norco, Vicodin), Oxycodone(Percocet, oxycontin) and hydromorphone amongst others.  Long term and even short term use of opiods can cause: Increased pain response Dependence Constipation Depression Respiratory depression And more.  Withdrawal symptoms can include Flu like symptoms Nausea, vomiting And more Techniques to manage these symptoms Hydrate well Eat regular healthy meals Stay active Use relaxation techniques(deep breathing, meditating, yoga) Do Not substitute Alcohol to help with tapering If you have been on opioids for less than two weeks and do not have pain than it is ok to stop all together.  Plan to wean off of opioids This plan should start within one week post op of your joint replacement. Maintain the same interval or time between taking each dose and first decrease the dose.  Cut the total daily intake of opioids by one tablet each day Next start to increase the time between doses. The last dose that should be eliminated is the evening dose.      Weight bearing as tolerated   Complete by: As directed    While in knee brace locked in full extension   Laterality: left   Extremity: Lower        Follow-up Information     Sheral Apley, MD. Go on 02/08/2024.  Specialty: Orthopedic Surgery Why: Your appointment is at 11:15am Contact information: [address] [place] [address] [phone]                  Signed: Marzetta Board Office [phone] 02/01/2024, 3:33 PM

## 2024-02-01 NOTE — TOC Transition Note (Signed)
 Transition of Care [facility]) - Discharge Note   Patient Details  Name: Helen Patterson MRN: [id number] Date of Birth: Aug 05, 1941  Transition of [facility]) CM/SW Contact:  Amada Jupiter, LCSW Phone Number: 02/01/2024, 12:54 PM   Clinical Narrative:     Met with pt today and she confirms she has needed DME in the home.  OPPT already set up with [facility].  She does ask about personal aide services in the home and have provided her with local agency information.  She understands she would set this up on her own and agreeable with this.  No further TOC needs.  Final next level of care: OP Rehab Barriers to Discharge: No Barriers Identified   Patient Goals and CMS Choice Patient states their goals for this hospitalization and ongoing recovery are:: return home          Discharge Placement                       Discharge Plan and Services Additional resources added to the After Visit Summary for                  DME Arranged: N/A DME Agency: NA                  Social Drivers of Health (SDOH) Interventions SDOH Screenings   Food Insecurity: No Food Insecurity (01/31/2024)  Housing: Low Risk  (01/31/2024)  Transportation Needs: No Transportation Needs (01/31/2024)  Utilities: Not At Risk (01/31/2024)  Financial Resource Strain: Low Risk  (08/25/2023)   Received from [facility]  Physical Activity: Sufficiently Active (08/01/2023)   Received from [facility]  Social Connections: Moderately Integrated (01/31/2024)  Stress: No Stress Concern Present (08/01/2023)   [facility]  Tobacco Use: Low Risk  (01/31/2024)  Health Literacy: Low Risk  (10/25/2022)   Received from [facility]     Readmission Risk Interventions    02/01/2024   12:53 PM  Readmission Risk Prevention Plan  Post Dischage Appt Complete  Medication Screening Complete  Transportation Screening Complete

## 2024-02-01 NOTE — Evaluation (Signed)
 Physical Therapy Evaluation Patient Details Name: Helen Patterson MRN: [id number] DOB: [birth year] Today's Date: 02/01/2024  History of Present Illness  Helen Patterson is an 83 yo female s/p ORIF L patella 01/31/24. PMH: axiety, CKD, depression, GERD, osteopenia, thyroid cancer s/p thyroidectomy  Clinical Impression  Pt is s/p L patella ORIF resulting in the deficits listed below (see PT Problem List). Pt from home, ind at baseline, lives with roommate, pt lives on main level and roommate lives upstairs. Pt reports fell at church Sunday resulting in patella fracture, has been using rollator and KI since then, also has RW and toilet riser at home. On eval, pt mobilizes to EOB with supv, KI donned correctly. Pt able to power to stand with CGA using RW from elevated bed, maintains LLE extended with KI donned. Pt ambulates in hallway with RW, step to pattern, decreased cadence, no increase in pain with ambulation, KI remains in place, no knee buckling or LOB noted. Pt hopeful to d/c home with roommate support; plan for pm session for stair training with roommate present per pt request. Pt will benefit from acute skilled PT to increase their independence and safety with mobility to allow discharge.            If plan is discharge home, recommend the following: A little help with walking and/or transfers;A little help with bathing/dressing/bathroom;Assistance with cooking/housework;Assist for transportation;Help with stairs or ramp for entrance   Can travel by private vehicle        Equipment Recommendations None recommended by PT  Recommendations for Other Services       Functional Status Assessment Patient has had a recent decline in their functional status and demonstrates the ability to make significant improvements in function in a reasonable and predictable amount of time.     Precautions / Restrictions Precautions Precautions: Knee;Fall Required Braces or Orthoses: Knee Immobilizer -  Left Knee Immobilizer - Left: On at all times (no knee flexion; braced locked in extension at all times) Restrictions Weight Bearing Restrictions Per Provider Order: No Other Position/Activity Restrictions: WBAT in knee immobilizer      Mobility  Bed Mobility Overal bed mobility: Needs Assistance Bed Mobility: Supine to Sit     Supine to sit: Supervision     General bed mobility comments: pt self mobilizing LLE to EOB    Transfers Overall transfer level: Needs assistance Equipment used: Rolling walker (2 wheels) Transfers: Sit to/from Stand Sit to Stand: Contact guard assist, From elevated surface           General transfer comment: CGA to power up from elevated bed, LLE extended with KI on, slow and steady rising    Ambulation/Gait Ambulation/Gait assistance: Supervision Gait Distance (Feet): 100 Feet Assistive device: Rolling walker (2 wheels) Gait Pattern/deviations: Step-to pattern, Decreased stride length Gait velocity: decreased     General Gait Details: step-to gait pattern with decreased cadence, KI applied without movement noted, no knee buckling noted, increased time with turns, verbal cues for body positioning within RW frame with turns  Stairs            Wheelchair Mobility     Tilt Bed    Modified Rankin (Stroke Patients Only)       Balance Overall balance assessment: Mild deficits observed, not formally tested  Pertinent Vitals/Pain Pain Assessment Pain Assessment: 0-10 Pain Score: 1  Pain Location: L knee Pain Intervention(s): Limited activity within patient's tolerance, Monitored during session    Home Living Family/patient expects to be discharged to:: Private residence Living Arrangements: Non-relatives/Friends Available Help at Discharge: Friend(s);Available 24 hours/day Type of Home: House (condo) Home Access: Stairs to enter Entrance Stairs-Rails:  None Entrance Stairs-Number of Steps: threshold   Home Layout: Two level;Able to live on main level with bedroom/bathroom Home Equipment: Rollator (4 wheels);Rolling Walker (2 wheels);Toilet riser Additional Comments: pt reports she lives downstairs and friend Helen Patterson lives upstairs; fell and broke patella on Sat, picked up rollator and toilet riser on the way home from urgent care when she learned she broke her patella, has been using knee immobilizer and rollator since Saturday    Prior Function Prior Level of Function : Independent/Modified Independent;Driving             Mobility Comments: pt ind, walks daily for exercise ADLs Comments: pt ind     Extremity/Trunk Assessment        Lower Extremity Assessment Lower Extremity Assessment: LLE deficits/detail LLE Deficits / Details: ankle and hip AROM WFL, knee with KI on, denies numbness/tingling LLE Sensation: WNL LLE Coordination: WNL    Cervical / Trunk Assessment Cervical / Trunk Assessment: Normal  Communication        Cognition Arousal: Alert Behavior During Therapy: WFL for tasks assessed/performed   PT - Cognitive impairments: No apparent impairments                         Following commands: Intact       Cueing Cueing Techniques: Verbal cues     General Comments      Exercises     Assessment/Plan    PT Assessment Patient needs continued PT services  PT Problem List Decreased strength;Decreased range of motion;Decreased activity tolerance;Decreased knowledge of use of DME;Pain       PT Treatment Interventions DME instruction;Gait training;Stair training;Functional mobility training;Therapeutic activities;Therapeutic exercise;Balance training;Neuromuscular re-education;Cognitive remediation;Patient/family education;Modalities    PT Goals (Current goals can be found in the Care Plan section)  Acute Rehab PT Goals Patient Stated Goal: return home PT Goal Formulation: With patient Time  For Goal Achievement: 02/15/24 Potential to Achieve Goals: Good    Frequency 7X/week     Co-evaluation               AM-PAC PT "6 Clicks" Mobility  Outcome Measure Help needed turning from your back to your side while in a flat bed without using bedrails?: A Little Help needed moving from lying on your back to sitting on the side of a flat bed without using bedrails?: A Little Help needed moving to and from a bed to a chair (including a wheelchair)?: A Little Help needed standing up from a chair using your arms (e.g., wheelchair or bedside chair)?: A Little Help needed to walk in hospital room?: A Little Help needed climbing 3-5 steps with a railing? : A Little 6 Click Score: 18    End of Session Equipment Utilized During Treatment: Gait belt Activity Tolerance: Patient tolerated treatment well Patient left: in chair;with call bell/phone within reach;with chair alarm set Nurse Communication: Mobility status PT Visit Diagnosis: Other abnormalities of gait and mobility (R26.89);Difficulty in walking, not elsewhere classified (R26.2)    Time: 1610-9604 PT Time Calculation (min) (ACUTE ONLY): 35 min   Charges:   PT Evaluation $PT Eval Moderate Complexity:  1 Mod PT Treatments $Gait Training: 8-22 mins PT General Charges $$ ACUTE PT VISIT: 1 Visit         Tori Kasheena Sambrano PT, DPT 02/01/24, 9:58 AM

## 2024-02-01 NOTE — Progress Notes (Signed)
 Physical Therapy Treatment Patient Details Name: Helen Patterson MRN: [id number] DOB: 09/28/1941 Today's Date: 02/01/2024   History of Present Illness Helen Patterson is an 83 yo female s/p ORIF L patella 01/31/24. PMH: axiety, CKD, depression, GERD, osteopenia, thyroid cancer s/p thyroidectomy    PT Comments  Pt up in recliner, motivated to return home today. Pt amb 150 ft with RW, step to pattern, no knee buckling or overt LOB, KI securely in place throughout session. Pt able to ascend/descend curb step 2x with RW to simulate entering/exiting the threshold step into the home, supv for safety, initial education on sequencing and cues for RW and foot positioning prior to stepping up/down. Pt reports ready to d/c home with roommate support, plans to begin OPPT 3/7, all questions answered.   If plan is discharge home, recommend the following: A little help with walking and/or transfers;A little help with bathing/dressing/bathroom;Assistance with cooking/housework;Assist for transportation;Help with stairs or ramp for entrance   Can travel by private vehicle        Equipment Recommendations  None recommended by PT    Recommendations for Other Services       Precautions / Restrictions Precautions Precautions: Knee;Fall Required Braces or Orthoses: Knee Immobilizer - Left Knee Immobilizer - Left: On at all times (no knee flexion; braced locked in extension at all times) Restrictions Weight Bearing Restrictions Per Provider Order: No Other Position/Activity Restrictions: WBAT in knee immobilizer     Mobility  Bed Mobility General bed mobility comments: in recliner upon arrival    Transfers Overall transfer level: Needs assistance Equipment used: Rolling walker (2 wheels) Transfers: Sit to/from Stand Sit to Stand: Supervision           General transfer comment: supv for STS transfer from recliner, good hand placement, LLE extended with KI on    Ambulation/Gait Ambulation/Gait  assistance: Supervision Gait Distance (Feet): 150 Feet Assistive device: Rolling walker (2 wheels) Gait Pattern/deviations: Step-to pattern, Decreased stride length Gait velocity: decreased     General Gait Details: step-to gait pattern, KI donned without movement noted, no knee bucklin gnoted, verbal cues for body positioning to RW and upright posture   Stairs Stairs: Yes Stairs assistance: Supervision Stair Management: With walker Number of Stairs: 2 (curb x2) General stair comments: pt ascends/descends curb step 2x with RW, verbal cues for RW and foot positioning prior to stepping up/down, supv for safety   Wheelchair Mobility     Tilt Bed    Modified Rankin (Stroke Patients Only)       Balance Overall balance assessment: Mild deficits observed, not formally tested                                          Communication    Cognition Arousal: Alert Behavior During Therapy: WFL for tasks assessed/performed   PT - Cognitive impairments: No apparent impairments                         Following commands: Intact      Cueing Cueing Techniques: Verbal cues  Exercises      General Comments        Pertinent Vitals/Pain Pain Assessment Pain Assessment: 0-10 Pain Score: 1  Pain Location: L knee Pain Descriptors / Indicators: Sore Pain Intervention(s): Limited activity within patient's tolerance, Monitored during session, Repositioned, Premedicated before session    Home Living  Prior Function            PT Goals (current goals can now be found in the care plan section) Acute Rehab PT Goals Patient Stated Goal: return home PT Goal Formulation: With patient Time For Goal Achievement: 02/15/24 Potential to Achieve Goals: Good Progress towards PT goals: Progressing toward goals    Frequency    7X/week      PT Plan      Co-evaluation              AM-PAC PT "6 Clicks" Mobility    Outcome Measure  Help needed turning from your back to your side while in a flat bed without using bedrails?: A Little Help needed moving from lying on your back to sitting on the side of a flat bed without using bedrails?: A Little Help needed moving to and from a bed to a chair (including a wheelchair)?: A Little Help needed standing up from a chair using your arms (e.g., wheelchair or bedside chair)?: A Little Help needed to walk in hospital room?: A Little Help needed climbing 3-5 steps with a railing? : A Little 6 Click Score: 18    End of Session Equipment Utilized During Treatment: Gait belt Activity Tolerance: Patient tolerated treatment well Patient left: in chair;with call bell/phone within reach;with chair alarm set Nurse Communication: Mobility status PT Visit Diagnosis: Other abnormalities of gait and mobility (R26.89);Difficulty in walking, not elsewhere classified (R26.2)     Time: 5621-3086 PT Time Calculation (min) (ACUTE ONLY): 14 min  Charges:    $Gait Training: 8-22 mins PT General Charges $$ ACUTE PT VISIT: 1 Visit                     Tori Stepfanie Yott PT, DPT 02/01/24, 1:41 PM

## 2024-02-01 NOTE — Progress Notes (Signed)
    Subjective: Patient reports pain as mild to moderate.  Tolerating diet.  Urinating.   No CP, SOB.  Has mobilized OOB with PT. Good spirits  Objective:   VITALS:   Vitals:   01/31/24 2234 02/01/24 0212 02/01/24 0606 02/01/24 1000  BP: (!) 148/67 (!) 152/74 (!) 152/71 (!) 151/73  Pulse: 84 77 83 90  Resp: 17 16 17 18   Temp: 98.2 F (36.8 C) 98 F (36.7 C) (!) 97.5 F (36.4 C) 98.5 F (36.9 C)  TempSrc: Oral Oral Oral   SpO2: 97% 100% 100% 100%  Weight:      Height:          Latest Ref Rng & Units 01/31/2024    4:45 PM 01/26/2024    9:48 AM  CBC  WBC 4.0 - 10.5 K/uL 6.1  4.2   Hemoglobin 12.0 - 15.0 g/dL 96.0  45.4   Hematocrit 36.0 - 46.0 % 32.0  31.6   Platelets 150 - 400 K/uL 307  282       Latest Ref Rng & Units 02/01/2024    3:29 AM 01/31/2024    4:45 PM 01/26/2024    9:48 AM  BMP  Glucose 70 - 99 mg/dL 098   119   BUN 8 - 23 mg/dL 22   20   Creatinine 1.47 - 1.00 mg/dL 8.29  5.62  1.30   Sodium 135 - 145 mmol/L 134   132   Potassium 3.5 - 5.1 mmol/L 4.0   3.7   Chloride 98 - 111 mmol/L 103   103   CO2 22 - 32 mmol/L 22   20   Calcium 8.9 - 10.3 mg/dL 8.6   8.9    Intake/Output      03/04 0701 03/05 0700 03/05 0701 03/06 0700   P.O. 545 1080   I.V. (mL/kg) 600 (10.2)    IV Piggyback 396.6    Total Intake(mL/kg) 1541.6 (26.1) 1080 (18.3)   Urine (mL/kg/hr) 200 850 (2)   Emesis/NG output  0   Stool  0   Blood 15    Total Output 215 850   Net +1326.6 +230        Urine Occurrence 8 x 1 x   Stool Occurrence  0 x   Emesis Occurrence  0 x      Physical Exam: General: NAD.  Sitting up in bedside chair, calm, comfortable Resp: No increased wob Cardio: regular rate and rhythm ABD soft Neurologically intact MSK Neurovascularly intact Sensation intact distally Intact pulses distally Dorsiflexion/Plantar flexion intact Incision: dressing C/D/I Bledsoe brace in place  Assessment: 1 Day Post-Op  S/P Procedure(s) (LRB): OPEN REDUCTION INTERNAL  FIXATION (ORIF) PATELLA (Left) by Dr. [name]. Helen Patterson on 01/31/24  Principal Problem:   Left patella fracture   Plan:  Advance diet Up with therapy Incentive Spirometry Elevate and Apply ice  Weightbearing: WBAT LLE when in brace locked in full extension Insicional and dressing care: Dressings left intact until follow-up and Reinforce dressings as needed Orthopedic device(s):  Bledsoe brace Showering: Keep dressing dry VTE prophylaxis:  Lovenox 30mg   , SCDs, ambulation Pain control: PRN Follow - up plan: 2 weeks Contact information:  Margarita Rana MD, Levester Fresh PA-C  Dispo: Home today     Jenne Pane, [place] Office [phone] 02/01/2024, 2:19 PM

## 2024-05-07 ENCOUNTER — Other Ambulatory Visit: Payer: Self-pay | Admitting: Registered Nurse

## 2024-05-07 DIAGNOSIS — M7989 Other specified soft tissue disorders: Secondary | ICD-10-CM

## 2024-05-21 ENCOUNTER — Encounter: Admitting: Family Medicine

## 2024-06-05 ENCOUNTER — Other Ambulatory Visit: Payer: Self-pay | Admitting: Registered Nurse

## 2024-06-05 ENCOUNTER — Ambulatory Visit
Admission: RE | Admit: 2024-06-05 | Discharge: 2024-06-05 | Disposition: A | Discharge status: Home / Self Care | Source: Ambulatory Visit | Attending: Registered Nurse

## 2024-06-05 ENCOUNTER — Ambulatory Visit
Admission: RE | Admit: 2024-06-05 | Discharge: 2024-06-05 | Disposition: A | Discharge status: Home / Self Care | Source: Ambulatory Visit | Attending: Registered Nurse | Admitting: Registered Nurse

## 2024-06-05 DIAGNOSIS — M7989 Other specified soft tissue disorders: Secondary | ICD-10-CM

## 2024-06-11 ENCOUNTER — Ambulatory Visit (INDEPENDENT_AMBULATORY_CARE_PROVIDER_SITE_OTHER): Admitting: Family Medicine

## 2024-06-11 ENCOUNTER — Encounter: Payer: Self-pay | Admitting: Family Medicine

## 2024-06-11 VITALS — BP 118/72

## 2024-06-11 DIAGNOSIS — M2141 Flat foot [pes planus] (acquired), right foot: Secondary | ICD-10-CM

## 2024-06-11 DIAGNOSIS — M2142 Flat foot [pes planus] (acquired), left foot: Secondary | ICD-10-CM | POA: Diagnosis not present

## 2024-06-11 NOTE — Assessment & Plan Note (Signed)
 Bilateral pes planus, has responded well to custom orthotics in the past.  Here for updated orthotics today  Plan: - Orthotics fabricated in the office as noted below  Patient was fitted for a : standard, cushioned, semi-rigid orthotic. The orthotic was heated, placed on the orthotic stand. The patient was positioned in subtalar neutral position and 10 degrees of ankle dorsiflexion in a weight bearing stance on the heated orthotic blank After completion of molding, a stable base was applied to the orthotic blank. The blank was ground to a stable position for weight bearing. Blank: thin profile, black with yellow EVA Base:blue EVA Posting: none   Orthotics were comfortable in the office today and she had a neutral gait.  Will follow-up with us  on an as-needed basis

## 2024-06-11 NOTE — Progress Notes (Signed)
 DATE OF VISIT: 06/11/2024        Helen Patterson DOB: [birth year] MRN: [id number]  CC:  custom orthotics  History of present Illness: Helen Patterson is a 83 y.o. female who presents for a follow-up visit for custom orthotics Last set made 08/31/22 - prefers the orthotics with yellow EVA and slimmer profile  Medications:  Outpatient Encounter Medications as of 06/11/2024  Medication Sig   ARIPiprazole  (ABILIFY ) 2 MG tablet Take 1 mg by mouth daily.   aspirin  EC 81 MG tablet Take 1 tablet (81 mg total) by mouth 2 (two) times daily. To prevent blood clots for 30 days after surgery.   b complex vitamins capsule Take 1 capsule by mouth every morning.   buPROPion ER (WELLBUTRIN SR) 100 MG 12 hr tablet Take 50 mg by mouth every morning.   Cholecalciferol (VITAMIN D) 50 MCG (2000 UT) tablet Take 2,000 Units by mouth daily.   clomiPRAMINE  (ANAFRANIL ) 25 MG capsule Take 25 mg by mouth at bedtime.   Desoximetasone (TOPICORT) 0.25 % ointment Apply 1 Application topically 2 (two) times daily.   famotidine  (PEPCID ) 20 MG tablet Take 20 mg by mouth daily as needed for heartburn or indigestion.   FARXIGA  5 MG TABS tablet Take 5 mg by mouth daily.   fenofibrate 160 MG tablet Take 160 mg by mouth every other day.   HYDROcodone -acetaminophen  (NORCO) 10-325 MG tablet Take 1-2 tablets by mouth every 6 (six) hours as needed for severe pain (pain score 7-10).   levothyroxine  (SYNTHROID ) 100 MCG tablet Take 100 mcg by mouth daily before breakfast.   NON FORMULARY Pt uses a c-pap nightly   ondansetron  (ZOFRAN -ODT) 4 MG disintegrating tablet Take 1 tablet (4 mg total) by mouth every 8 (eight) hours as needed for nausea or vomiting.   OVER THE COUNTER MEDICATION Probiotic gummies- take 2 daily   traZODone (DESYREL) 50 MG tablet Take 16.6 mg by mouth at bedtime.   No facility-administered encounter medications on file as of 06/11/2024.    Allergies: is allergic to amoxicillin.  Physical Examination: Vitals: BP  118/72  GENERAL:  Helen Patterson is a 83 y.o. female appearing their stated age, alert and oriented x 3, in no apparent distress.  MSK: Lateral feet with pes planus, also with widening of the transverse arches bilaterally.  Hallux valgus bilaterally.  Walking without a limp. Neurovascular intact distally  Assessment & Plan Pes planus of both feet Bilateral pes planus, has responded well to custom orthotics in the past.  Here for updated orthotics today  Plan: - Orthotics fabricated in the office as noted below  Patient was fitted for a : standard, cushioned, semi-rigid orthotic. The orthotic was heated, placed on the orthotic stand. The patient was positioned in subtalar neutral position and 10 degrees of ankle dorsiflexion in a weight bearing stance on the heated orthotic blank After completion of molding, a stable base was applied to the orthotic blank. The blank was ground to a stable position for weight bearing. Blank: thin profile, black with yellow EVA Base:blue EVA Posting: none   Orthotics were comfortable in the office today and she had a neutral gait.  Will follow-up with us  on an as-needed basis   Patient expressed understanding & agreement with above.  Encounter Diagnosis  Name Primary?   Pes planus of both feet Yes    No orders of the defined types were placed in this encounter.

## 2024-06-11 NOTE — Patient Instructions (Signed)
 Thank you for coming to see me today. It was a pleasure.   We made you new orthotics. Let us know if we need to add any extra cushioning or make changes.  If you have any questions or concerns, please do not hesitate to call the office at [phone].

## 2024-07-18 ENCOUNTER — Other Ambulatory Visit: Payer: Self-pay | Admitting: Orthopedic Surgery

## 2024-07-18 DIAGNOSIS — M25562 Pain in left knee: Secondary | ICD-10-CM

## 2024-07-27 ENCOUNTER — Ambulatory Visit
Admission: RE | Admit: 2024-07-27 | Discharge: 2024-07-27 | Disposition: A | Discharge status: Home / Self Care | Source: Ambulatory Visit | Attending: Orthopedic Surgery | Admitting: Orthopedic Surgery

## 2024-07-27 DIAGNOSIS — M25562 Pain in left knee: Secondary | ICD-10-CM
